# Patient Record
Sex: Male | Born: 2000 | Race: Black or African American | Hispanic: No | Marital: Single | Smoking: Former smoker
Health system: Southern US, Community
[De-identification: ages and names within clinical notes are randomized; demographics above are authoritative.]

## PROBLEM LIST (undated history)

## (undated) DIAGNOSIS — J45909 Unspecified asthma, uncomplicated: Secondary | ICD-10-CM

---
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
## 2019-11-21 ENCOUNTER — Emergency Department
Admission: EM | Admit: 2019-11-21 | Discharge: 2019-11-21 | Disposition: A | Discharge status: Home / Self Care | Payer: Medicaid Other | Attending: Emergency Medicine | Admitting: Emergency Medicine

## 2019-11-21 ENCOUNTER — Emergency Department: Payer: Medicaid Other

## 2019-11-21 ENCOUNTER — Other Ambulatory Visit: Payer: Self-pay

## 2019-11-21 ENCOUNTER — Encounter: Payer: Self-pay | Admitting: Emergency Medicine

## 2019-11-21 DIAGNOSIS — R05 Cough: Secondary | ICD-10-CM | POA: Diagnosis present

## 2019-11-21 DIAGNOSIS — J4521 Mild intermittent asthma with (acute) exacerbation: Secondary | ICD-10-CM | POA: Diagnosis not present

## 2019-11-21 DIAGNOSIS — F17200 Nicotine dependence, unspecified, uncomplicated: Secondary | ICD-10-CM | POA: Diagnosis not present

## 2019-11-21 HISTORY — DX: Unspecified asthma, uncomplicated: J45.909

## 2019-11-21 MED ORDER — PREDNISONE 10 MG PO TABS: ORAL_TABLET | ORAL | 0 refills | Status: DC

## 2019-11-21 MED ORDER — DEXAMETHASONE SODIUM PHOSPHATE 10 MG/ML IJ SOLN
10.0000 mg | Freq: Once | INTRAMUSCULAR | Status: DC
  Filled 2019-11-21: qty 1

## 2019-11-21 MED ORDER — IPRATROPIUM-ALBUTEROL 0.5-2.5 (3) MG/3ML IN SOLN
3.0000 mL | Freq: Once | RESPIRATORY_TRACT | Status: AC
  Administered 2019-11-21: 3 mL via RESPIRATORY_TRACT
  Filled 2019-11-21: qty 3

## 2019-11-21 MED ORDER — ALBUTEROL SULFATE HFA 108 (90 BASE) MCG/ACT IN AERS: 2.0000 | INHALATION_SPRAY | Freq: Four times a day (QID) | RESPIRATORY_TRACT | 0 refills | Status: DC | PRN

## 2019-11-21 MED ORDER — DEXAMETHASONE SODIUM PHOSPHATE 10 MG/ML IJ SOLN
10.0000 mg | Freq: Once | INTRAMUSCULAR | Status: AC
  Administered 2019-11-21: 10 mg via INTRAMUSCULAR

## 2019-11-21 NOTE — Discharge Instructions (Addendum)
You were seen today for an asthma exacerbation.  Your chest x-ray was negative.  You were given a nebulizer treatment and steroid injection.  I have given you prescription for oral steroids to take for the next 6 days.  Please avoid NSAIDs OTC such as Aleve, ibuprofen, naproxen, or Motrin.  I have also given you a prescription for an inhaler to take every 4-6 hours as needed.

## 2019-11-21 NOTE — ED Notes (Signed)
Pt reports he still feels some tightness/SOB. Respirations are even and regular, breathing is labored. Inspiratory wheezes noted bilaterally.

## 2019-11-21 NOTE — ED Triage Notes (Signed)
Patient states that he is having an asthma attack that started this morning. Patient states that he is out of his inhaler. Patient with expiratory wheezes throughout lung fields.

## 2019-11-21 NOTE — ED Provider Notes (Signed)
[facility] Emergency Department Provider Note ____________________________________________  Time seen: 2225  I have reviewed the triage vital signs and the nursing notes.  HISTORY  Chief Complaint  Asthma   HPI Tyler Friedman is a 19 y.o. male presents to the ER today with complaint of cough, wheezing and shortness of breath.  He reports this started this morning.  The cough is mostly nonproductive.  He denies headache, runny nose, nasal congestion, ear pain, sore throat, loss of taste or smell.  He denies fever, chills or body aches.  He has a history of asthma but has not had an asthma attack in the last 2 to 3 years.  He reports he did have an inhaler but it has run out.  He does smoke.  He has not had sick contacts or exposure to Covid that he is aware of.  Past Medical History:  Diagnosis Date  . Asthma     There are no problems to display for this patient.   History reviewed. No pertinent surgical history.  Prior to Admission medications   Medication Sig Start Date End Date Taking? Authorizing Provider  albuterol (VENTOLIN HFA) 108 (90 Base) MCG/ACT inhaler Inhale 2 puffs into the lungs every 6 (six) hours as needed for wheezing or shortness of breath. 11/21/19   Kent Braunschweig, Salvadore Oxford, NP  predniSONE (DELTASONE) 10 MG tablet Take 6 tabs day 1, 5 tabs day 2, 4 tabs day 3, 3 tabs day 4, 2 tabs day 5, 1 tab day 6 11/21/19   Lorre Munroe, NP    Allergies Patient has no known allergies.  No family history on file.  Social History Social History   Tobacco Use  . Smoking status: Current Every Day Smoker  . Smokeless tobacco: Never Used  Substance Use Topics  . Alcohol use: Never  . Drug use: Never    Review of Systems  Constitutional: Negative for fever, chills or body aches. Eyes: Negative for visual changes. ENT: Negative for runny nose, nasal congestion, ear pain, loss of taste or smell, or sore throat. Cardiovascular: Negative for chest pain or  chest tightness. Respiratory: Positive for cough and shortness of breath. ____________________________________________  PHYSICAL EXAM:  VITAL SIGNS: ED Triage Vitals  Enc Vitals Group     BP 11/21/19 2149 (!) 163/106     Pulse Rate 11/21/19 2149 92     Resp 11/21/19 2149 (!) 24     Temp 11/21/19 2149 98.6 F (37 C)     Temp Source 11/21/19 2149 Oral     SpO2 11/21/19 2149 97 %     Weight 11/21/19 2150 300 lb (136.1 kg)     Height 11/21/19 2150 5\' 10"  (1.778 m)     Head Circumference --      Peak Flow --      Pain Score 11/21/19 2150 0     Pain Loc --      Pain Edu? --      Excl. in GC? --     Constitutional: Alert and oriented.  Obese, in no distress. Head: Normocephalic and atraumatic. Eyes: Conjunctivae are normal. PERRL. Normal extraocular movements Ears: Canals clear. TMs intact bilaterally. Nose: No congestion/rhinorrhea/epistaxis. Mouth/Throat: Mucous membranes are moist.  No posterior pharynx exudate or erythema noted. Hematological/Lymphatic/Immunological: No cervical lymphadenopathy. Cardiovascular: Normal rate, regular rhythm.  Respiratory: Normal respiratory effort.  Intermittent left expiratory wheeze noted.  No rales or rhonchi noted. Neurologic:  Normal gait without ataxia. Normal speech and language. No gross  focal neurologic deficits are appreciated. Skin:  Skin is warm, dry and intact. No rash noted.  ______________________________________________   RADIOLOGY   Imaging Orders     DG Chest 2 View IMPRESSION:  No active cardiopulmonary disease.    ____________________________________________   INITIAL IMPRESSION / ASSESSMENT AND PLAN / ED COURSE  Cough, Wheezing and SOB secondary to Asthma:  Chest xray negative Duoneb x 1 in ER Decadron 10 mg IM x1 RX for Albuterol inhaler 1-2 puffs Q4-6H prn RX for Pred Taper x 6 days ____________________________________________  FINAL CLINICAL IMPRESSION(S) / ED DIAGNOSES  Final diagnoses:  Mild  intermittent asthma with exacerbation      Jearld Fenton, NP 11/21/19 2302    Earleen Newport, MD 11/21/19 2308

## 2019-12-24 ENCOUNTER — Other Ambulatory Visit: Payer: Self-pay

## 2019-12-24 ENCOUNTER — Emergency Department
Admission: EM | Admit: 2019-12-24 | Discharge: 2019-12-24 | Disposition: A | Discharge status: Home / Self Care | Payer: Medicaid Other | Attending: Emergency Medicine | Admitting: Emergency Medicine

## 2019-12-24 DIAGNOSIS — F17210 Nicotine dependence, cigarettes, uncomplicated: Secondary | ICD-10-CM | POA: Diagnosis not present

## 2019-12-24 DIAGNOSIS — Z760 Encounter for issue of repeat prescription: Secondary | ICD-10-CM | POA: Insufficient documentation

## 2019-12-24 MED ORDER — ALBUTEROL SULFATE HFA 108 (90 BASE) MCG/ACT IN AERS
2.0000 | INHALATION_SPRAY | Freq: Four times a day (QID) | RESPIRATORY_TRACT | 0 refills | Status: DC | PRN
Start: 2019-12-24 — End: 2020-01-14

## 2019-12-24 NOTE — Discharge Instructions (Signed)
Call all 3 of the clinics listed on your discharge papers to see if you can obtain a primary care provider.  The albuterol inhaler was sent to your pharmacy.

## 2019-12-24 NOTE — ED Triage Notes (Signed)
Pt states he has asthma and is out of his albuterol inhaler, states he does not have a PCP . Pt is in NAD, respirations WNL.

## 2019-12-24 NOTE — ED Provider Notes (Signed)
[facility] Emergency Department Provider Note   ____________________________________________   First MD Initiated Contact with Patient 12/24/19 [phone]     (approximate)  I have reviewed the triage vital signs and the nursing notes.   HISTORY  Chief Complaint Medication Refill    HPI Tyler Friedman is a 19 y.o. male presents for an albuterol inhaler.  He states he does not have a PCP since he was 17.  He reports that he is currently trying to stop smoking.      Past Medical History:  Diagnosis Date   Asthma     There are no problems to display for this patient.   History reviewed. No pertinent surgical history.  Prior to Admission medications   Medication Sig Start Date End Date Taking? Authorizing Provider  albuterol (VENTOLIN HFA) 108 (90 Base) MCG/ACT inhaler Inhale 2 puffs into the lungs every 6 (six) hours as needed for wheezing or shortness of breath. 12/24/19   Tommi Rumps, PA-C    Allergies Patient has no known allergies.  No family history on file.  Social History Social History   Tobacco Use   Smoking status: Current Every Day Smoker    Types: Cigarettes   Smokeless tobacco: Never Used  Substance Use Topics   Alcohol use: Never   Drug use: Never    Review of Systems Constitutional: No fever/chills Eyes: No visual changes. ENT: No sore throat. Cardiovascular: Denies chest pain. Respiratory: Denies shortness of breath.  History of asthma. Gastrointestinal: No abdominal pain.  No constipation. Musculoskeletal: Negative for muscle skeletal pain. Skin: Negative for rash. Neurological: Negative for headaches, focal weakness or numbness.  ____________________________________________   PHYSICAL EXAM:  VITAL SIGNS: ED Triage Vitals  Enc Vitals Group     BP 12/24/19 0743 138/88     Pulse Rate 12/24/19 0743 78     Resp 12/24/19 0743 17     Temp 12/24/19 0743 97.6 F (36.4 C)     Temp Source 12/24/19 0743  Oral     SpO2 12/24/19 0743 97 %     Weight 12/24/19 0740 290 lb (131.5 kg)     Height 12/24/19 0740 5\' 10"  (1.778 m)     Head Circumference --      Peak Flow --      Pain Score 12/24/19 0740 0     Pain Loc --      Pain Edu? --      Excl. in GC? --    Constitutional: Alert and oriented. Well appearing and in no acute distress. Eyes: Conjunctivae are normal. PERRL. EOMI. Head: Atraumatic. Nose: No congestion/rhinnorhea. Neck: No stridor.   Cardiovascular: Normal rate, regular rhythm. Grossly normal heart sounds.  Good peripheral circulation. Respiratory: Normal respiratory effort.  No retractions. Lungs minimal expiratory wheeze noted.  Patient is able to speak in complete sentences without any difficulty. Gastrointestinal: Soft and nontender. No distention.  Musculoskeletal: Moves upper and lower extremities without any difficulty normal gait was noted. Neurologic:  Normal speech and language. No gross focal neurologic deficits are appreciated. No gait instability. Skin:  Skin is warm, dry and intact. No rash noted. Psychiatric: Mood and affect are normal. Speech and behavior are normal.  ____________________________________________   LABS (all labs ordered are listed, but only abnormal results are displayed)  Labs Reviewed - No data to display  PROCEDURES  Procedure(s) performed (including Critical Care):  Procedures   ____________________________________________   INITIAL IMPRESSION / ASSESSMENT AND PLAN / ED COURSE  19 year old male presents to the ED for refill of his albuterol inhaler.  Patient states he does not have a PCP.  He was encouraged to get a PCP and a list of clinics was on his discharge papers.  He is to call each 1 to see if they are taking any new patients.  Albuterol inhaler was sent to his pharmacy. ____________________________________________   FINAL CLINICAL IMPRESSION(S) / ED DIAGNOSES  Final diagnoses:  Encounter for medication refill      ED Discharge Orders         Ordered    albuterol (VENTOLIN HFA) 108 (90 Base) MCG/ACT inhaler  Every 6 hours PRN     Discontinue  Reprint     12/24/19 0834           Note:  This document was prepared using Dragon voice recognition software and may include unintentional dictation errors.    Tommi Rumps, PA-C 12/24/19 5366    Concha Se, MD 12/24/19 [phone]

## 2020-01-14 ENCOUNTER — Other Ambulatory Visit: Payer: Self-pay

## 2020-01-14 ENCOUNTER — Encounter: Payer: Self-pay | Admitting: Emergency Medicine

## 2020-01-14 ENCOUNTER — Emergency Department
Admission: EM | Admit: 2020-01-14 | Discharge: 2020-01-14 | Disposition: A | Discharge status: Home / Self Care | Payer: Medicaid Other | Attending: Emergency Medicine | Admitting: Emergency Medicine

## 2020-01-14 DIAGNOSIS — Z760 Encounter for issue of repeat prescription: Secondary | ICD-10-CM

## 2020-01-14 DIAGNOSIS — R030 Elevated blood-pressure reading, without diagnosis of hypertension: Secondary | ICD-10-CM | POA: Insufficient documentation

## 2020-01-14 MED ORDER — ALBUTEROL SULFATE HFA 108 (90 BASE) MCG/ACT IN AERS
2.0000 | INHALATION_SPRAY | Freq: Four times a day (QID) | RESPIRATORY_TRACT | 0 refills | Status: DC | PRN
Start: 2020-01-14 — End: 2021-11-16

## 2020-01-14 NOTE — ED Triage Notes (Signed)
Needs a refill for his inhaler.  Has appointment with PCP, but appointment is not until August.   AAOx3.  Skin warm and dry.  NAD

## 2020-01-14 NOTE — ED Provider Notes (Signed)
[facility] Emergency Department Provider Note   ____________________________________________   None    (approximate)  I have reviewed the triage vital signs and the nursing notes.   HISTORY  Chief Complaint Medication Refill    HPI Tyler Friedman is a 19 y.o. male patient requesting refill of Ventolin for asthmatic condition.  Patient state he has has establish care with PCP but cannot be seen until August.  Patient denies any discomfort at this time.  Patient also has elevated blood pressure noted in triage.  Patient states this will be further evaluated by his new PCP next month.         Past Medical History:  Diagnosis Date  . Asthma     There are no problems to display for this patient.   History reviewed. No pertinent surgical history.  Prior to Admission medications   Medication Sig Start Date End Date Taking? Authorizing Provider  albuterol (VENTOLIN HFA) 108 (90 Base) MCG/ACT inhaler Inhale 2 puffs into the lungs every 6 (six) hours as needed for wheezing or shortness of breath. 01/14/20   Joni Reining, PA-C    Allergies Patient has no known allergies.  No family history on file.  Social History Social History   Tobacco Use  . Smoking status: Current Every Day Smoker    Types: Cigarettes  . Smokeless tobacco: Never Used  Substance Use Topics  . Alcohol use: Never  . Drug use: Never    Review of Systems Constitutional: No fever/chills Eyes: No visual changes. ENT: No sore throat. Cardiovascular: Denies chest pain. Respiratory: Denies shortness of breath. Gastrointestinal: No abdominal pain.  No nausea, no vomiting.  No diarrhea.  No constipation. Genitourinary: Negative for dysuria. Musculoskeletal: Negative for back pain. Skin: Negative for rash. Neurological: Negative for headaches, focal weakness or numbness.   ____________________________________________   PHYSICAL EXAM:  VITAL SIGNS: ED Triage Vitals    Enc Vitals Group     BP 01/14/20 0907 (!) 179/81     Pulse Rate 01/14/20 0907 66     Resp 01/14/20 0907 18     Temp 01/14/20 0907 98.1 F (36.7 C)     Temp Source 01/14/20 0907 Oral     SpO2 01/14/20 0907 97 %     Weight 01/14/20 0902 289 lb 14.5 oz (131.5 kg)     Height 01/14/20 0902 5\' 10"  (1.778 m)     Head Circumference --      Peak Flow --      Pain Score 01/14/20 0902 0     Pain Loc --      Pain Edu? --      Excl. in GC? --    Constitutional: Alert and oriented. Well appearing and in no acute distress. Neck: No stridor.  Cardiovascular: Normal rate, regular rhythm. Grossly normal heart sounds.  Good peripheral circulation. Respiratory: Normal respiratory effort.  No retractions. Lungs CTAB.   ____________________________________________   LABS (all labs ordered are listed, but only abnormal results are displayed)  Labs Reviewed - No data to display ____________________________________________  EKG   ____________________________________________  RADIOLOGY  ED MD interpretation:    Official radiology report(s): No results found.  ____________________________________________   PROCEDURES  Procedure(s) performed (including Critical Care):  Procedures   ____________________________________________   INITIAL IMPRESSION / ASSESSMENT AND PLAN / ED COURSE  As part of my medical decision making, I reviewed the following data within the electronic MEDICAL RECORD NUMBER     Patient presents for  refill of Ventolin for his asthmatic condition.  Patient that he needs medication to tide him over until he can see his new PCP next month.    OSHAY Friedman was evaluated in Emergency Department on 01/14/2020 for the symptoms described in the history of present illness. He was evaluated in the context of the global COVID-19 pandemic, which necessitated consideration that the patient might be at risk for infection with the SARS-CoV-2 virus that causes COVID-19. Institutional  protocols and algorithms that pertain to the evaluation of patients at risk for COVID-19 are in a state of rapid change based on information released by regulatory bodies including the CDC and federal and state organizations. These policies and algorithms were followed during the patient's care in the ED.       ____________________________________________   FINAL CLINICAL IMPRESSION(S) / ED DIAGNOSES  Final diagnoses:  Medication refill     ED Discharge Orders         Ordered    albuterol (VENTOLIN HFA) 108 (90 Base) MCG/ACT inhaler  Every 6 hours PRN     Discontinue  Reprint     01/14/20 0934           Note:  This document was prepared using Dragon voice recognition software and may include unintentional dictation errors.    Joni Reining, PA-C 01/14/20 [id number]    Sharman Cheek, MD 01/14/20 1459

## 2020-01-14 NOTE — ED Notes (Signed)
Pt here for refill on albuterol inhaler.  Has appointment to set up PCP but cannot get in until august. No complaints today.

## 2020-11-14 ENCOUNTER — Emergency Department
Admission: EM | Admit: 2020-11-14 | Discharge: 2020-11-14 | Disposition: A | Discharge status: Home / Self Care | Payer: Medicaid Other | Attending: Emergency Medicine | Admitting: Emergency Medicine

## 2020-11-14 ENCOUNTER — Emergency Department: Payer: Medicaid Other

## 2020-11-14 ENCOUNTER — Other Ambulatory Visit: Payer: Self-pay

## 2020-11-14 DIAGNOSIS — J4521 Mild intermittent asthma with (acute) exacerbation: Secondary | ICD-10-CM | POA: Insufficient documentation

## 2020-11-14 DIAGNOSIS — R059 Cough, unspecified: Secondary | ICD-10-CM | POA: Diagnosis present

## 2020-11-14 DIAGNOSIS — F17210 Nicotine dependence, cigarettes, uncomplicated: Secondary | ICD-10-CM | POA: Insufficient documentation

## 2020-11-14 MED ORDER — BENZONATATE 100 MG PO CAPS: 200.0000 mg | ORAL_CAPSULE | Freq: Three times a day (TID) | ORAL | 0 refills | Status: AC | PRN

## 2020-11-14 MED ORDER — ALBUTEROL SULFATE HFA 108 (90 BASE) MCG/ACT IN AERS
2.0000 | INHALATION_SPRAY | Freq: Four times a day (QID) | RESPIRATORY_TRACT | 2 refills | Status: DC | PRN
Start: 2020-11-14 — End: 2021-11-16

## 2020-11-14 MED ORDER — METHYLPREDNISOLONE 4 MG PO TBPK
ORAL_TABLET | ORAL | 0 refills | Status: DC
Start: 2020-11-14 — End: 2021-11-16

## 2020-11-14 MED ORDER — PREDNISONE 20 MG PO TABS
60.0000 mg | ORAL_TABLET | Freq: Once | ORAL | Status: AC
  Administered 2020-11-14: 60 mg via ORAL
  Filled 2020-11-14: qty 3

## 2020-11-14 MED ORDER — IPRATROPIUM-ALBUTEROL 0.5-2.5 (3) MG/3ML IN SOLN
3.0000 mL | Freq: Once | RESPIRATORY_TRACT | Status: AC
  Administered 2020-11-14: 3 mL via RESPIRATORY_TRACT
  Filled 2020-11-14: qty 3

## 2020-11-14 NOTE — ED Provider Notes (Signed)
[facility] Emergency Department Provider Note   ____________________________________________   Event Date/Time   First MD Initiated Contact with Patient 11/14/20 [phone]     (approximate)  I have reviewed the triage vital signs and the nursing notes.   HISTORY  Chief Complaint Asthma    HPI Tyler Friedman is a 20 y.o. male patient presents with wheezing and coughing.  Patient believes allergies is making his wheezing worse.  Patient has a history of asthma but is out of his inhaler.  Last asthma attack was greater than 6 months ago.         Past Medical History:  Diagnosis Date  . Asthma     There are no problems to display for this patient.   History reviewed. No pertinent surgical history.  Prior to Admission medications   Medication Sig Start Date End Date Taking? Authorizing Provider  albuterol (VENTOLIN HFA) 108 (90 Base) MCG/ACT inhaler Inhale 2 puffs into the lungs every 6 (six) hours as needed for wheezing or shortness of breath. 11/14/20  Yes Joni Reining, PA-C  benzonatate (TESSALON PERLES) 100 MG capsule Take 2 capsules (200 mg total) by mouth 3 (three) times daily as needed. 11/14/20 11/14/21 Yes Joni Reining, PA-C  methylPREDNISolone (MEDROL DOSEPAK) 4 MG TBPK tablet Take Tapered dose as directed 11/14/20  Yes Joni Reining, PA-C  albuterol (VENTOLIN HFA) 108 (90 Base) MCG/ACT inhaler Inhale 2 puffs into the lungs every 6 (six) hours as needed for wheezing or shortness of breath. 01/14/20   Joni Reining, PA-C    Allergies Patient has no known allergies.  No family history on file.  Social History Social History   Tobacco Use  . Smoking status: Current Every Day Smoker    Types: Cigarettes  . Smokeless tobacco: Never Used  Substance Use Topics  . Alcohol use: Never  . Drug use: Never    Review of Systems Constitutional: No fever/chills Eyes: No visual changes. ENT: No sore throat. Cardiovascular: Denies chest  pain. Respiratory: Mild shortness of breath and wheezing. Gastrointestinal: No abdominal pain.  No nausea, no vomiting.  No diarrhea.  No constipation. Genitourinary: Negative for dysuria. Musculoskeletal: Negative for back pain. Skin: Negative for rash. Neurological: Negative for headaches, focal weakness or numbness.   ____________________________________________   PHYSICAL EXAM:  VITAL SIGNS: ED Triage Vitals  Enc Vitals Group     BP 11/14/20 0737 (!) 143/89     Pulse Rate 11/14/20 0737 73     Resp --      Temp 11/14/20 0737 99.5 F (37.5 C)     Temp Source 11/14/20 0737 Oral     SpO2 11/14/20 0737 95 %     Weight 11/14/20 0746 289 lb 14.5 oz (131.5 kg)     Height 11/14/20 0746 5\' 10"  (1.778 m)     Head Circumference --      Peak Flow --      Pain Score 11/14/20 0734 0     Pain Loc --      Pain Edu? --      Excl. in GC? --    Constitutional: Alert and oriented. Well appearing and in no acute distress. Nose: No congestion/rhinnorhea. Mouth/Throat: Mucous membranes are moist.  Oropharynx non-erythematous. Neck: No stridor.  Hematological/Lymphatic/Immunilogical: No cervical lymphadenopathy. Cardiovascular: Normal rate, regular rhythm. Grossly normal heart sounds.  Good peripheral circulation. Respiratory: Normal respiratory effort.  No retractions. Lungs inspiratory wheezing. Gastrointestinal: Soft and nontender. No distention. No abdominal  bruits. No CVA tenderness. Musculoskeletal: No lower extremity tenderness nor edema.  No joint effusions. Neurologic:  Normal speech and language. No gross focal neurologic deficits are appreciated. No gait instability. Skin:  Skin is warm, dry and intact. No rash noted. Psychiatric: Mood and affect are normal. Speech and behavior are normal.  ____________________________________________   LABS (all labs ordered are listed, but only abnormal results are displayed)  Labs Reviewed - No data to  display ____________________________________________  EKG   ____________________________________________  RADIOLOGY I, Joni Reining, personally viewed and evaluated these images (plain radiographs) as part of my medical decision making, as well as reviewing the written report by the radiologist.  ED MD interpretation: No acute findings on chest x-ray. Official radiology report(s): DG Chest 2 View  Result Date: 11/14/2020 CLINICAL DATA:  Cough, shortness of breath and wheezing. History of asthma. EXAM: CHEST - 2 VIEW COMPARISON:  None. FINDINGS: The heart size and mediastinal contours are within normal limits. There is no evidence of pulmonary edema, consolidation, pneumothorax, nodule or pleural fluid. No significant hyperinflation. The visualized skeletal structures are unremarkable. IMPRESSION: No active cardiopulmonary disease. Electronically Signed   By: Irish Lack M.D.   On: 11/14/2020 08:34    ____________________________________________   PROCEDURES  Procedure(s) performed (including Critical Care):  Procedures   ____________________________________________   INITIAL IMPRESSION / ASSESSMENT AND PLAN / ED COURSE  As part of my medical decision making, I reviewed the following data within the electronic MEDICAL RECORD NUMBER         Patient presents with an asthma attack.  Patient believes her allergies are worsening his condition.  Patient is out of his and prednisone.  Since he is not in target over 6 months.  Discussed no acute findings on chest x-ray.  Patient condition improved status post 1 DuoNeb treatment.  Patient given discharge care instructions and prescription.  Patient vies follow-up PCP.      ____________________________________________   FINAL CLINICAL IMPRESSION(S) / ED DIAGNOSES  Final diagnoses:  Mild intermittent asthma with exacerbation     ED Discharge Orders         Ordered    albuterol (VENTOLIN HFA) 108 (90 Base) MCG/ACT inhaler   Every 6 hours PRN        11/14/20 0900    methylPREDNISolone (MEDROL DOSEPAK) 4 MG TBPK tablet        11/14/20 0900    benzonatate (TESSALON PERLES) 100 MG capsule  3 times daily PRN        11/14/20 0900          *Please note:  Tyler Friedman was evaluated in Emergency Department on 11/14/2020 for the symptoms described in the history of present illness. He was evaluated in the context of the global COVID-19 pandemic, which necessitated consideration that the patient might be at risk for infection with the SARS-CoV-2 virus that causes COVID-19. Institutional protocols and algorithms that pertain to the evaluation of patients at risk for COVID-19 are in a state of rapid change based on information released by regulatory bodies including the CDC and federal and state organizations. These policies and algorithms were followed during the patient's care in the ED.  Some ED evaluations and interventions may be delayed as a result of limited staffing during and the pandemic.*   Note:  This document was prepared using Dragon voice recognition software and may include unintentional dictation errors.    Joni Reining, PA-C 11/14/20 7628    Sharyn Creamer, MD 11/14/20  1618  

## 2020-11-14 NOTE — ED Notes (Signed)
See triage note  Presents with some SOB and wheezing   States he is out of his inhaler    Last used was about 9 months ago

## 2020-11-14 NOTE — Discharge Instructions (Signed)
Read and follow discharge care

## 2020-11-14 NOTE — ED Triage Notes (Signed)
Pt comes with c/o possible asthma flare up. Pt state allergies are making it worse. Pt denies any inhaler or meds for relief. Respirations even and unlabored.

## 2021-04-06 ENCOUNTER — Other Ambulatory Visit: Payer: Self-pay

## 2021-04-06 ENCOUNTER — Emergency Department
Admission: EM | Admit: 2021-04-06 | Discharge: 2021-04-06 | Disposition: A | Discharge status: Home / Self Care | Payer: Medicaid Other | Attending: Emergency Medicine | Admitting: Emergency Medicine

## 2021-04-06 DIAGNOSIS — J45909 Unspecified asthma, uncomplicated: Secondary | ICD-10-CM | POA: Insufficient documentation

## 2021-04-06 DIAGNOSIS — R110 Nausea: Secondary | ICD-10-CM | POA: Insufficient documentation

## 2021-04-06 DIAGNOSIS — F17210 Nicotine dependence, cigarettes, uncomplicated: Secondary | ICD-10-CM | POA: Diagnosis not present

## 2021-04-06 LAB — COMPREHENSIVE METABOLIC PANEL
ALT: 34 U/L (ref 0–44)
AST: 26 U/L (ref 15–41)
Albumin: 4.2 g/dL (ref 3.5–5.0)
Alkaline Phosphatase: 72 U/L (ref 38–126)
Anion gap: 8 (ref 5–15)
BUN: 15 mg/dL (ref 6–20)
CO2: 23 mmol/L (ref 22–32)
Calcium: 9.2 mg/dL (ref 8.9–10.3)
Chloride: 105 mmol/L (ref 98–111)
Creatinine, Ser: 0.71 mg/dL (ref 0.61–1.24)
GFR, Estimated: >60 mL/min (ref >60)
Glucose, Bld: 120 mg/dL — ABNORMAL HIGH (ref 70–99)
Potassium: 3.9 mmol/L (ref 3.5–5.1)
Sodium: 136 mmol/L (ref 135–145)
Total Bilirubin: 0.8 mg/dL (ref 0.3–1.2)
Total Protein: 8.1 g/dL (ref 6.5–8.1)

## 2021-04-06 LAB — CBC
HCT: 42.1 % (ref 39.0–52.0)
Hemoglobin: 14.2 g/dL (ref 13.0–17.0)
MCH: 30.1 pg (ref 26.0–34.0)
MCHC: 33.7 g/dL (ref 30.0–36.0)
MCV: 89.4 fL (ref 80.0–100.0)
Platelets: 265 10*3/uL (ref 150–400)
RBC: 4.71 MIL/uL (ref 4.22–5.81)
RDW: 11.6 % (ref 11.5–15.5)
WBC: 9.5 10*3/uL (ref 4.0–10.5)
nRBC: 0 % (ref 0.0–0.2)

## 2021-04-06 LAB — URINALYSIS, COMPLETE (UACMP) WITH MICROSCOPIC
Bacteria, UA: NONE SEEN
Bilirubin Urine: NEGATIVE
Glucose, UA: NEGATIVE mg/dL
Hgb urine dipstick: NEGATIVE
Ketones, ur: NEGATIVE mg/dL
Leukocytes,Ua: NEGATIVE
Nitrite: NEGATIVE
Protein, ur: NEGATIVE mg/dL
Specific Gravity, Urine: 1.023 (ref 1.005–1.030)
Squamous Epithelial / HPF: NONE SEEN (ref 0–5)
pH: 6 (ref 5.0–8.0)

## 2021-04-06 LAB — LIPASE, BLOOD: Lipase: 30 U/L (ref 11–51)

## 2021-04-06 NOTE — ED Provider Notes (Signed)
[facility] Emergency Department Provider Note  ____________________________________________   Event Date/Time   First MD Initiated Contact with Patient 04/06/21 1525     (approximate)  I have reviewed the triage vital signs and the nursing notes.   HISTORY  Chief Complaint Nausea    HPI Tyler Friedman is a 20 y.o. male presents with complaints of nausea and he would also like his embedded piercings in his lips removed.  Denies fever, chills, vomiting diarrhea  Past Medical History:  Diagnosis Date   Asthma     There are no problems to display for this patient.   No past surgical history on file.  Prior to Admission medications   Medication Sig Start Date End Date Taking? Authorizing Provider  albuterol (VENTOLIN HFA) 108 (90 Base) MCG/ACT inhaler Inhale 2 puffs into the lungs every 6 (six) hours as needed for wheezing or shortness of breath. 01/14/20   Joni Reining, PA-C  albuterol (VENTOLIN HFA) 108 (90 Base) MCG/ACT inhaler Inhale 2 puffs into the lungs every 6 (six) hours as needed for wheezing or shortness of breath. 11/14/20   Joni Reining, PA-C  benzonatate (TESSALON PERLES) 100 MG capsule Take 2 capsules (200 mg total) by mouth 3 (three) times daily as needed. 11/14/20 11/14/21  Joni Reining, PA-C  methylPREDNISolone (MEDROL DOSEPAK) 4 MG TBPK tablet Take Tapered dose as directed 11/14/20   Joni Reining, PA-C    Allergies Patient has no known allergies.  No family history on file.  Social History Social History   Tobacco Use   Smoking status: Every Day    Types: Cigarettes   Smokeless tobacco: Never  Substance Use Topics   Alcohol use: Never   Drug use: Never    Review of Systems  Constitutional: No fever/chills Eyes: No visual changes. ENT: No sore throat. Respiratory: Denies cough Cardiovascular: Denies chest pain Gastrointestinal: Denies abdominal pain Genitourinary: Negative for dysuria. Musculoskeletal:  Negative for back pain. Skin: Negative for rash. Psychiatric: no mood changes,     ____________________________________________   PHYSICAL EXAM:  VITAL SIGNS: ED Triage Vitals [04/06/21 1111]  Enc Vitals Group     BP (!) 127/104     Pulse Rate 76     Resp 18     Temp 99.2 F (37.3 C)     Temp Source Oral     SpO2 99 %     Weight (!) 306 lb (138.8 kg)     Height 5\' 10"  (1.778 m)     Head Circumference      Peak Flow      Pain Score 0     Pain Loc      Pain Edu?      Excl. in GC?     Constitutional: Alert and oriented. Well appearing and in no acute distress. Eyes: Conjunctivae are normal.  Head: Atraumatic. Nose: No congestion/rhinnorhea. Mouth/Throat: Mucous membranes are moist.   Neck:  supple no lymphadenopathy noted Cardiovascular: Normal rate, regular rhythm. Heart sounds are normal Respiratory: Normal respiratory effort.  No retractions, lungs c t a  Abd: soft nontender bs normal all 4 quad GU: deferred Musculoskeletal: FROM all extremities, warm and well perfused Neurologic:  Normal speech and language.  Skin:  Skin is warm, dry and intact. No rash noted.  2 embedded piercings noted in the lip Psychiatric: Mood and affect are normal. Speech and behavior are normal.  ____________________________________________   LABS (all labs ordered are listed, but only abnormal  results are displayed)  Labs Reviewed  COMPREHENSIVE METABOLIC PANEL - Abnormal; Notable for the following components:      Result Value   Glucose, Bld 120 (*)    All other components within normal limits  URINALYSIS, COMPLETE (UACMP) WITH MICROSCOPIC - Abnormal; Notable for the following components:   Color, Urine YELLOW (*)    APPearance CLEAR (*)    All other components within normal limits  LIPASE, BLOOD  CBC    ____________________________________________   ____________________________________________  RADIOLOGY    ____________________________________________   PROCEDURES  Procedure(s) performed: No  Procedures    ____________________________________________   INITIAL IMPRESSION / ASSESSMENT AND PLAN / ED COURSE  Pertinent labs & imaging results that were available during my care of the patient were reviewed by me and considered in my medical decision making (see chart for details).   Patient's 20 year old male presents with nausea and requesting if we can give him advice about piercing removal.  See HPI.  Physical exam shows patient were stable.  DDx: Gastroenteritis, acute pancreatitis, foreign body  Labs are reassuring, CBC, metabolic panel, lipase and urinalysis are all normal  Patient is to follow-up with surgeon to have the piercings removed.  They are deeply embedded and have been there for a long time.  Do not feel this appropriate at this time.  States he understands.  Very strict plan.  Discharged stable condition instructions to return if worsening     Tyler Friedman was evaluated in Emergency Department on 04/06/2021 for the symptoms described in the history of present illness. He was evaluated in the context of the global COVID-19 pandemic, which necessitated consideration that the patient might be at risk for infection with the SARS-CoV-2 virus that causes COVID-19. Institutional protocols and algorithms that pertain to the evaluation of patients at risk for COVID-19 are in a state of rapid change based on information released by regulatory bodies including the CDC and federal and state organizations. These policies and algorithms were followed during the patient's care in the ED.    As part of my medical decision making, I reviewed the following data within the electronic MEDICAL RECORD NUMBER Nursing notes reviewed and incorporated, Labs reviewed , Old chart reviewed,  Notes from prior ED visits, and [place] Controlled Substance Database  ____________________________________________   FINAL CLINICAL IMPRESSION(S) / ED DIAGNOSES  Final diagnoses:  Nausea      NEW MEDICATIONS STARTED DURING THIS VISIT:  New Prescriptions   No medications on file     Note:  This document was prepared using Dragon voice recognition software and may include unintentional dictation errors.    Faythe Ghee, PA-C 04/06/21 1541    Sharman Cheek, MD 04/06/21 [phone]

## 2021-04-06 NOTE — ED Notes (Signed)
See triage note  presents with some nausea  states he was nauseated on Tuesday and had 2 episodes of vomiting at that time  denies any sx's at present  needs work note

## 2021-04-06 NOTE — ED Notes (Signed)
Patient stable and discharged with all personal belongings and AVS. AVS and discharge instructions reviewed with patient and opportunity for questions provided.   

## 2021-04-06 NOTE — ED Triage Notes (Signed)
Pt here with nausea that started Tuesday. Pt states that he had 2 episodes of emesis. Pt denies abd pain but states that he feels really sick. PT in NAD in triage. Pt also wants to know if he can get his piercings cut out because they are embedded in his lip.

## 2021-04-06 NOTE — Discharge Instructions (Addendum)
Follow up with a surgeon to have the piercings removed

## 2021-04-06 NOTE — ED Provider Notes (Addendum)
Emergency Medicine Provider Triage Evaluation Note  Tyler Friedman , a 20 y.o. male  was evaluated in triage.  Pt complains of nausea and would also like for someone to cut the piercing out of his lip.  Review of Systems  Positive: Nausea Negative: No fever, chills, chest pain, shortness of breath or abdominal pain, vomiting or diarrhea  Physical Exam  BP (!) 127/104 (BP Location: Left Arm)   Pulse 76   Temp 99.2 F (37.3 C) (Oral)   Resp 18   Ht 5\' 10"  (1.778 m)   Wt (!) 138.8 kg   SpO2 99%   BMI 43.91 kg/m  Gen:   Awake, no distress   Resp:  Normal effort  MSK:   Moves extremities without difficulty  Other:    Medical Decision Making  Medically screening exam initiated at 11:15 AM.  Appropriate orders placed.  Tyler Friedman was informed that the remainder of the evaluation will be completed by another provider, this initial triage assessment does not replace that evaluation, and the importance of remaining in the ED until their evaluation is complete.     Ane Payment, PA-C 04/06/21 1115    04/08/21 Sherrie Mustache, PA-C 04/06/21 1529    04/08/21, MD 04/06/21 [phone]

## 2021-11-16 ENCOUNTER — Other Ambulatory Visit: Payer: Self-pay

## 2021-11-16 ENCOUNTER — Emergency Department
Admission: EM | Admit: 2021-11-16 | Discharge: 2021-11-16 | Disposition: A | Discharge status: Home / Self Care | Payer: 59 | Attending: Emergency Medicine | Admitting: Emergency Medicine

## 2021-11-16 ENCOUNTER — Encounter: Payer: Self-pay | Admitting: Emergency Medicine

## 2021-11-16 DIAGNOSIS — J45901 Unspecified asthma with (acute) exacerbation: Secondary | ICD-10-CM | POA: Insufficient documentation

## 2021-11-16 DIAGNOSIS — R0602 Shortness of breath: Secondary | ICD-10-CM | POA: Diagnosis not present

## 2021-11-16 MED ORDER — ALBUTEROL SULFATE HFA 108 (90 BASE) MCG/ACT IN AERS: 2.0000 | INHALATION_SPRAY | RESPIRATORY_TRACT | 1 refills | Status: DC | PRN

## 2021-11-16 MED ORDER — PREDNISONE 20 MG PO TABS
60.0000 mg | ORAL_TABLET | Freq: Once | ORAL | Status: AC
  Administered 2021-11-16: 60 mg via ORAL
  Filled 2021-11-16: qty 3

## 2021-11-16 MED ORDER — PREDNISONE 20 MG PO TABS: 60.0000 mg | ORAL_TABLET | Freq: Every day | ORAL | 0 refills | Status: DC

## 2021-11-16 MED ORDER — IPRATROPIUM-ALBUTEROL 0.5-2.5 (3) MG/3ML IN SOLN
3.0000 mL | Freq: Once | RESPIRATORY_TRACT | Status: AC
  Administered 2021-11-16: 3 mL via RESPIRATORY_TRACT
  Filled 2021-11-16: qty 3

## 2021-11-16 MED ORDER — ALBUTEROL SULFATE HFA 108 (90 BASE) MCG/ACT IN AERS
2.0000 | INHALATION_SPRAY | Freq: Once | RESPIRATORY_TRACT | Status: AC
  Administered 2021-11-16: 2 via RESPIRATORY_TRACT
  Filled 2021-11-16: qty 6.7

## 2021-11-16 MED ORDER — IPRATROPIUM-ALBUTEROL 0.5-2.5 (3) MG/3ML IN SOLN
3.0000 mL | RESPIRATORY_TRACT | Status: AC
  Administered 2021-11-16 (×2): 3 mL via RESPIRATORY_TRACT
  Filled 2021-11-16: qty 6

## 2021-11-16 NOTE — ED Provider Notes (Signed)
[facility] Provider Note    Event Date/Time   First MD Initiated Contact with Patient 11/16/21 [phone]     (approximate)   History   Asthma   HPI  Tyler Friedman is a 21 y.o. male with history of asthma who presents to the emergency department with shortness of breath and wheezing that started yesterday.  States he does not have an albuterol inhaler for home.  No chest pain, fevers or cough.  No lower extremity swelling or pain.  Feels like his typical asthma exacerbation.   History provided by patient.    Past Medical History:  Diagnosis Date   Asthma     No past surgical history on file.  MEDICATIONS:  Prior to Admission medications   Medication Sig Start Date End Date Taking? Authorizing Provider  albuterol (VENTOLIN HFA) 108 (90 Base) MCG/ACT inhaler Inhale 2 puffs into the lungs every 6 (six) hours as needed for wheezing or shortness of breath. 01/14/20   Joni Reining, PA-C  albuterol (VENTOLIN HFA) 108 (90 Base) MCG/ACT inhaler Inhale 2 puffs into the lungs every 6 (six) hours as needed for wheezing or shortness of breath. 11/14/20   Joni Reining, PA-C  methylPREDNISolone (MEDROL DOSEPAK) 4 MG TBPK tablet Take Tapered dose as directed 11/14/20   Joni Reining, PA-C    Physical Exam   Triage Vital Signs: ED Triage Vitals  Enc Vitals Group     BP 11/16/21 0449 138/63     Pulse Rate 11/16/21 0449 74     Resp 11/16/21 0449 20     Temp 11/16/21 0449 98 F (36.7 C)     Temp Source 11/16/21 0449 Oral     SpO2 11/16/21 0449 98 %     Weight 11/16/21 0445 286 lb (129.7 kg)     Height 11/16/21 0445 5\' 10"  (1.778 m)     Head Circumference --      Peak Flow --      Pain Score 11/16/21 0448 0     Pain Loc --      Pain Edu? --      Excl. in GC? --     Most recent vital signs: Vitals:   11/16/21 0449 11/16/21 0500  BP: 138/63 (!) 127/55  Pulse: 74 68  Resp: 20 16  Temp: 98 F (36.7 C)   SpO2: 98% 96%    CONSTITUTIONAL: Alert and  oriented and responds appropriately to questions. Well-appearing; well-nourished HEAD: Normocephalic, atraumatic EYES: Conjunctivae clear, pupils appear equal, sclera nonicteric ENT: normal nose; moist mucous membranes NECK: Supple, normal ROM CARD: RRR; S1 and S2 appreciated; no murmurs, no clicks, no rubs, no gallops RESP: Patient has diffuse inspiratory and expiratory wheezing.  No rhonchi or rales.  Speaking full sentences.  No hypoxia or respiratory distress. ABD/GI: Normal bowel sounds; non-distended; soft, non-tender, no rebound, no guarding, no peritoneal signs BACK: The back appears normal EXT: Normal ROM in all joints; no deformity noted, no edema; no cyanosis, no calf tenderness or calf swelling SKIN: Normal color for age and race; warm; no rash on exposed skin NEURO: Moves all extremities equally, normal speech PSYCH: The patient's mood and manner are appropriate.   ED Results / Procedures / Treatments   LABS: (all labs ordered are listed, but only abnormal results are displayed) Labs Reviewed - No data to display   EKG:  RADIOLOGY: My personal review and interpretation of imaging:    I have personally reviewed all radiology  reports.   No results found.   PROCEDURES:  Critical Care performed: No     Procedures    IMPRESSION / MDM / ASSESSMENT AND PLAN / ED COURSE  I reviewed the triage vital signs and the nursing notes.    Patient here with complaints of an asthma exacerbation.  No hypoxia or respiratory distress.     DIFFERENTIAL DIAGNOSIS (includes but not limited to):   Asthma exacerbation, doubt pneumonia, CHF, pneumothorax, PE, ACS   PLAN: We will give DuoNebs, prednisone.  I do not feel there is any indication for blood work or chest x-ray at this time.  Will monitor closely.   MEDICATIONS GIVEN IN ED: Medications  albuterol (VENTOLIN HFA) 108 (90 Base) MCG/ACT inhaler 2 puff (has no administration in time range)  ipratropium-albuterol  (DUONEB) 0.5-2.5 (3) MG/3ML nebulizer solution 3 mL (has no administration in time range)  ipratropium-albuterol (DUONEB) 0.5-2.5 (3) MG/3ML nebulizer solution 3 mL (3 mLs Nebulization Given 11/16/21 0509)  predniSONE (DELTASONE) tablet 60 mg (60 mg Oral Given 11/16/21 0506)     ED COURSE: Patient feels much better and feels like he is breathing easier.  Only some scattered expiratory wheezing on exam now.  Agrees to a third breathing treatment.  Will discharge with albuterol inhaler and give refills for home and prescription for prednisone burst.  Has PCP for follow-up.   At this time, I do not feel there is any life-threatening condition present. I reviewed all nursing notes, vitals, pertinent previous records.  All lab and urine results, EKGs, imaging ordered have been independently reviewed and interpreted by myself.  I reviewed all available radiology reports from any imaging ordered this visit.  Based on my assessment, I feel the patient is safe to be discharged home without further emergent workup and can continue workup as an outpatient as needed. Discussed all findings, treatment plan as well as usual and customary return precautions with patient.  They verbalize understanding and are comfortable with this plan.  Outpatient follow-up has been provided as needed.  All questions have been answered.    CONSULTS: No admission needed given no hypoxia or respiratory distress, breath sounds improving with breathing treatments.   OUTSIDE RECORDS REVIEWED: Reviewed patient's last office visit with Ronnette Juniper on 07/02/2017.         FINAL CLINICAL IMPRESSION(S) / ED DIAGNOSES   Final diagnoses:  Exacerbation of asthma, unspecified asthma severity, unspecified whether persistent     Rx / DC Orders   ED Discharge Orders          Ordered    predniSONE (DELTASONE) 20 MG tablet  Daily        11/16/21 0559    albuterol (VENTOLIN HFA) 108 (90 Base) MCG/ACT inhaler  Every 4 hours PRN         11/16/21 0559             Note:  This document was prepared using Dragon voice recognition software and may include unintentional dictation errors.   Imer Foxworth, Layla Maw, DO 11/16/21 [phone]

## 2021-11-16 NOTE — ED Notes (Signed)
Pt discharge information reviewed. Pt understands need for follow up care and when to return if symptoms worsen. All questions answered. Pt is alert and oriented with even and regular respirations. Pt is seen ambulating out of department with string steady gait.   

## 2021-11-16 NOTE — ED Notes (Signed)
Albuterol inhaler ordered from pharmacy  ?

## 2021-11-16 NOTE — ED Triage Notes (Signed)
Patient ambulatory to triage with steady gait, without difficulty or distress noted; pt reports wheezing tonight, nonprod cough; no inhaler in last yr

## 2022-01-01 ENCOUNTER — Emergency Department
Admission: EM | Admit: 2022-01-01 | Discharge: 2022-01-01 | Disposition: A | Discharge status: Home / Self Care | Payer: 59 | Attending: Emergency Medicine | Admitting: Emergency Medicine

## 2022-01-01 ENCOUNTER — Other Ambulatory Visit: Payer: Self-pay

## 2022-01-01 DIAGNOSIS — J45909 Unspecified asthma, uncomplicated: Secondary | ICD-10-CM | POA: Diagnosis not present

## 2022-01-01 DIAGNOSIS — Z760 Encounter for issue of repeat prescription: Secondary | ICD-10-CM | POA: Insufficient documentation

## 2022-01-01 MED ORDER — ALBUTEROL SULFATE HFA 108 (90 BASE) MCG/ACT IN AERS
1.0000 | INHALATION_SPRAY | Freq: Once | RESPIRATORY_TRACT | Status: AC
  Administered 2022-01-01: 1 via RESPIRATORY_TRACT
  Filled 2022-01-01 (×2): qty 6.7

## 2022-01-01 MED ORDER — ALBUTEROL SULFATE HFA 108 (90 BASE) MCG/ACT IN AERS: 2.0000 | INHALATION_SPRAY | RESPIRATORY_TRACT | 2 refills | Status: DC | PRN

## 2022-01-01 MED ORDER — ALBUTEROL SULFATE (2.5 MG/3ML) 0.083% IN NEBU
2.5000 mg | INHALATION_SOLUTION | Freq: Once | RESPIRATORY_TRACT | Status: DC
  Filled 2022-01-01: qty 3

## 2022-01-01 NOTE — ED Triage Notes (Signed)
Pt states he has asthma and only has 3 puffs left on his inhaler and needs a refill

## 2022-01-01 NOTE — ED Provider Notes (Signed)
[facility] Provider Note   Event Date/Time   First MD Initiated Contact with Patient 01/01/22 [phone]     (approximate) History  Medication Refill  HPI Tyler Friedman is a 21 y.o. male with a stated past medical history of asthma presents for medication refill of his albuterol inhaler.  Patient states that he currently uses inhaler infrequently however he has been unable to refill the prescription as he does not have the money to present to his primary physician's office for a refill.  Patient currently denies any active shortness of breath, chest pain, fever, productive cough, recent travel/sick contacts.   Physical Exam  Triage Vital Signs: ED Triage Vitals  Enc Vitals Group     BP 01/01/22 0924 (!) 143/80     Pulse Rate 01/01/22 0924 64     Resp 01/01/22 0924 20     Temp 01/01/22 0924 98.4 F (36.9 C)     Temp Source 01/01/22 0924 Oral     SpO2 01/01/22 0924 95 %     Weight 01/01/22 0925 285 lb 15 oz (129.7 kg)     Height 01/01/22 0925 5\' 10"  (1.778 m)     Head Circumference --      Peak Flow --      Pain Score 01/01/22 0923 0     Pain Loc --      Pain Edu? --      Excl. in GC? --    Most recent vital signs: Vitals:   01/01/22 0924  BP: (!) 143/80  Pulse: 64  Resp: 20  Temp: 98.4 F (36.9 C)  SpO2: 95%   General: Awake, oriented x4. CV:  Good peripheral perfusion.  Resp:  Normal effort.  Abd:  No distention.  Other:  Obese young adult African-American male sitting on stretcher in no acute distress ED Results / Procedures / Treatments   PROCEDURES: Critical Care performed: No Procedures MEDICATIONS ORDERED IN ED: Medications  albuterol (PROVENTIL) (2.5 MG/3ML) 0.083% nebulizer solution 2.5 mg (has no administration in time range)   IMPRESSION / MDM / ASSESSMENT AND PLAN / ED COURSE  I reviewed the triage vital signs and the nursing notes.                             The patient is on the cardiac monitor to evaluate for evidence of  arrhythmia and/or significant heart rate changes. Patient's presentation is most consistent with acute presentation with potential threat to life or bodily function. Patient is a 21 year old male who presents for medication refill of his albuterol inhaler.  Patient does not show any signs of active asthma laceration at this time and shows no active respiratory distress.  Patient denies any shortness of breath or chest pain.  Patient did request an albuterol inhaler treatment prior to discharge as he is feeling short of breath.  Patient given treatment prior to discharge as well as refill of his albuterol inhaler.  Dispo: Discharge home   FINAL CLINICAL IMPRESSION(S) / ED DIAGNOSES   Final diagnoses:  Medication refill   Rx / DC Orders   ED Discharge Orders          Ordered    albuterol (VENTOLIN HFA) 108 (90 Base) MCG/ACT inhaler  Every 4 hours PRN        01/01/22 0923           Note:  This document was prepared using Dragon voice  recognition software and may include unintentional dictation errors.   Merwyn Katos, MD 01/01/22 [phone]

## 2022-01-01 NOTE — ED Notes (Signed)
See triage note  Presents with a request to refill his inhaler  denies any other sx's

## 2022-04-09 ENCOUNTER — Emergency Department
Admission: EM | Admit: 2022-04-09 | Discharge: 2022-04-09 | Disposition: A | Discharge status: Home / Self Care | Payer: 59 | Attending: Student | Admitting: Student

## 2022-04-09 DIAGNOSIS — J45909 Unspecified asthma, uncomplicated: Secondary | ICD-10-CM | POA: Diagnosis not present

## 2022-04-09 DIAGNOSIS — Z760 Encounter for issue of repeat prescription: Secondary | ICD-10-CM | POA: Diagnosis not present

## 2022-04-09 MED ORDER — ALBUTEROL SULFATE HFA 108 (90 BASE) MCG/ACT IN AERS: 2.0000 | INHALATION_SPRAY | Freq: Four times a day (QID) | RESPIRATORY_TRACT | 2 refills | Status: DC | PRN

## 2022-04-09 NOTE — ED Provider Notes (Signed)
[facility] Provider Note    None    (approximate)   History   Medication Refill   HPI  Tyler Friedman is a 21 y.o. male who presents today with request for refill of his inhaler.  He reports that when he pushes that nothing comes out even though the medication is still full.  He thinks that the push tab is broken.  He does not currently feel short of breath or have any pain anywhere.  He has not been wheezing today.  He reports that he is trying to exercise more and wants to be sure that he has an inhaler at his disposal if needed.  He has no other complaints today.     Physical Exam   Triage Vital Signs: ED Triage Vitals [04/09/22 1022]  Enc Vitals Group     BP (!) 181/93     Pulse Rate (!) 57     Resp 16     Temp 97.8 F (36.6 C)     Temp Source Oral     SpO2 99 %     Weight      Height      Head Circumference      Peak Flow      Pain Score 0     Pain Loc      Pain Edu?      Excl. in [place]?     Most recent vital signs: Vitals:   04/09/22 1022  BP: (!) 181/93  Pulse: (!) 57  Resp: 16  Temp: 97.8 F (36.6 C)  SpO2: 99%    Physical Exam Vitals and nursing note reviewed.  Constitutional:      General: Awake and alert. No acute distress.    Appearance: Normal appearance. The patient is normal weight.  HENT:     Head: Normocephalic and atraumatic.     Mouth: Mucous membranes are moist.  Eyes:     General: PERRL. Normal EOMs        Right eye: No discharge.        Left eye: No discharge.     Conjunctiva/sclera: Conjunctivae normal.  Cardiovascular:     Rate and Rhythm: Normal rate and regular rhythm.     Pulses: Normal pulses.  Pulmonary:     Effort: Pulmonary effort is normal. No respiratory distress.     Breath sounds: Normal breath sounds.  No wheezing noted Abdominal:     Abdomen is soft. There is no abdominal tenderness. No rebound or guarding. No distention. Musculoskeletal:        General: No swelling. Normal range of  motion.     Cervical back: Normal range of motion and neck supple.  No lower extremity swelling Skin:    General: Skin is warm and dry.     Capillary Refill: Capillary refill takes less than 2 seconds.     Findings: No rash.  Neurological:     Mental Status: The patient is awake and alert.      ED Results / Procedures / Treatments   Labs (all labs ordered are listed, but only abnormal results are displayed) Labs Reviewed - No data to display   EKG     RADIOLOGY     PROCEDURES:  Critical Care performed:   Procedures   MEDICATIONS ORDERED IN ED: Medications - No data to display   IMPRESSION / MDM / [place] / ED COURSE  I reviewed the triage vital signs and the nursing notes.  Differential diagnosis includes, but is not limited to, medication refill, asthma exacerbation.  No chest pain or shortness of breath currently.  No tachycardia or hypoxia or leg swelling to suggest PE.  He has no current complaints other than requesting a refill of his inhaler to help when needed.  This was sent to his pharmacy.  We discussed return precautions and outpatient follow-up.  Patient understands and agrees with plan.  He was discharged in stable condition.   Patient's presentation is most consistent with acute, uncomplicated illness.    FINAL CLINICAL IMPRESSION(S) / ED DIAGNOSES   Final diagnoses:  Medication refill  Uncomplicated asthma, unspecified asthma severity, unspecified whether persistent     Rx / DC Orders   ED Discharge Orders          Ordered    albuterol (VENTOLIN HFA) 108 (90 Base) MCG/ACT inhaler  Every 6 hours PRN        04/09/22 1021             Note:  This document was prepared using Dragon voice recognition software and may include unintentional dictation errors.   Keturah Shavers 04/09/22 1221    Jene Every, MD 04/09/22 1440

## 2022-04-09 NOTE — Discharge Instructions (Addendum)
Your inhalers were refilled for you. Return for any new, worsening or change in symptoms or other concerns.

## 2022-04-09 NOTE — ED Provider Triage Note (Signed)
Emergency Medicine Provider Triage Evaluation Note  Tyler Friedman , a 21 y.o. male  was evaluated in triage.  Pt complains of medication refill.  He reports that his inhaler stopped releasing any of the medicine even though it still has medicine in it.  He reports that he gets stuck when he tries to push it down.  He does not feel short of breath or have any pain currently.  Review of Systems  Positive: Med refill Negative: Sob, chest pain, leg swelling  Physical Exam  There were no vitals taken for this visit. Gen:   Awake, no distress   Resp:  Normal effort  MSK:   Moves extremities without difficulty  Other:    Medical Decision Making  Medically screening exam initiated at 10:18 AM.  Appropriate orders placed.  Tyler Friedman was informed that the remainder of the evaluation will be completed by another provider, this initial triage assessment does not replace that evaluation, and the importance of remaining in the ED until their evaluation is complete.     Marquette Old, PA-C 04/09/22 1024

## 2022-04-09 NOTE — ED Triage Notes (Signed)
Pt to ED via POV, pt states that his inhaler broke and he needs a refill on it

## 2022-06-15 ENCOUNTER — Other Ambulatory Visit: Payer: Self-pay

## 2022-06-15 ENCOUNTER — Emergency Department
Admission: EM | Admit: 2022-06-15 | Discharge: 2022-06-15 | Disposition: A | Discharge status: Home / Self Care | Payer: 59 | Attending: Emergency Medicine | Admitting: Emergency Medicine

## 2022-06-15 DIAGNOSIS — J4520 Mild intermittent asthma, uncomplicated: Secondary | ICD-10-CM | POA: Insufficient documentation

## 2022-06-15 DIAGNOSIS — Z760 Encounter for issue of repeat prescription: Secondary | ICD-10-CM | POA: Diagnosis not present

## 2022-06-15 MED ORDER — ALBUTEROL SULFATE HFA 108 (90 BASE) MCG/ACT IN AERS: 2.0000 | INHALATION_SPRAY | Freq: Four times a day (QID) | RESPIRATORY_TRACT | 2 refills | Status: DC | PRN

## 2022-06-15 NOTE — ED Triage Notes (Signed)
Reports had a mild asthma attack at work this morning but is feeling better.  Reports its been under control with an inahler which he is out of and wanting a refill.

## 2022-06-15 NOTE — ED Notes (Signed)
States was moving really fast at work and states that it may have triggered his asthma. Needs an inhaler. Did not have it at work. Denies chest soreness or tightness.

## 2022-06-15 NOTE — ED Provider Notes (Addendum)
[facility] Provider Note    Event Date/Time   First MD Initiated Contact with Patient 06/15/22 1013     (approximate)   History   Medication Refill   HPI  Tyler Friedman is a 21 y.o. male   Past medical history of who presents to the emergency department requesting a refill of his albuterol inhaler which she ran out of.  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    No resp infectious symptoms and feels well now.  No other acute medical complaints.    History was obtained via the patient.      Physical Exam   Triage Vital Signs: ED Triage Vitals  Enc Vitals Group     BP 06/15/22 0934 (!) 153/69     Pulse Rate 06/15/22 0934 60     Resp 06/15/22 0934 18     Temp 06/15/22 0934 98.4 F (36.9 C)     Temp Source 06/15/22 0934 Oral     SpO2 06/15/22 0934 99 %     Weight 06/15/22 0935 285 lb (129.3 kg)     Height 06/15/22 0935 5\' 10"  (1.778 m)     Head Circumference --      Peak Flow --      Pain Score 06/15/22 0935 0     Pain Loc --      Pain Edu? --      Excl. in GC? --     Most recent vital signs: Vitals:   06/15/22 0934  BP: (!) 153/69  Pulse: 60  Resp: 18  Temp: 98.4 F (36.9 C)  SpO2: 99%    General: Awake, no distress.  CV:  Good peripheral perfusion.  Resp:  Normal effort.  Abd:  No distention.  Other:  Scant wheezing on auscultation at the apices bilaterally no respiratory distress hemodynamics appropriate and reassuring no hypoxemia.   ED Results / Procedures / Treatments   Labs (all labs ordered are listed, but only abnormal results are displayed) Labs Reviewed - No data to display   PROCEDURES:  Critical Care performed: No  Procedures   MEDICATIONS ORDERED IN ED: Medications - No data to display  IMPRESSION / MDM / ASSESSMENT AND PLAN / ED COURSE  I reviewed the triage vital  signs and the nursing notes.                              Differential diagnosis includes, but is not limited to, mild asthma exacerbation, respiratory infection, medication refill    MDM: Appears well, no acute respiratory distress very scant wheezing request for albuterol inhaler.  I prescribed this medication.  I do not think it is a steroid burst right now.  Anticipatory guidance given and follow-up with PMD and return if any worsening respiratory symptoms or new symptoms.  Patient's presentation is most consistent with acute presentation with potential threat to life or bodily function.       FINAL CLINICAL IMPRESSION(S) / ED DIAGNOSES   Final diagnoses:  Mild intermittent asthma without complication  Medication refill     Rx / DC Orders   ED Discharge Orders          Ordered    albuterol (VENTOLIN HFA) 108 (90 Base) MCG/ACT inhaler  Every 6 hours PRN  06/15/22 1022             Note:  This document was prepared using Dragon voice recognition software and may include unintentional dictation errors.    Pilar Jarvis, MD 06/15/22 1028    Pilar Jarvis, MD 06/15/22 [phone]

## 2022-09-24 IMAGING — CR DG CHEST 2V
2 series · 2 of 2 positions shown · non-contrast
Comparison: None.

CLINICAL DATA: Cough, shortness of breath and wheezing. History of
asthma.

EXAM:
CHEST - 2 VIEW

[chest pa]
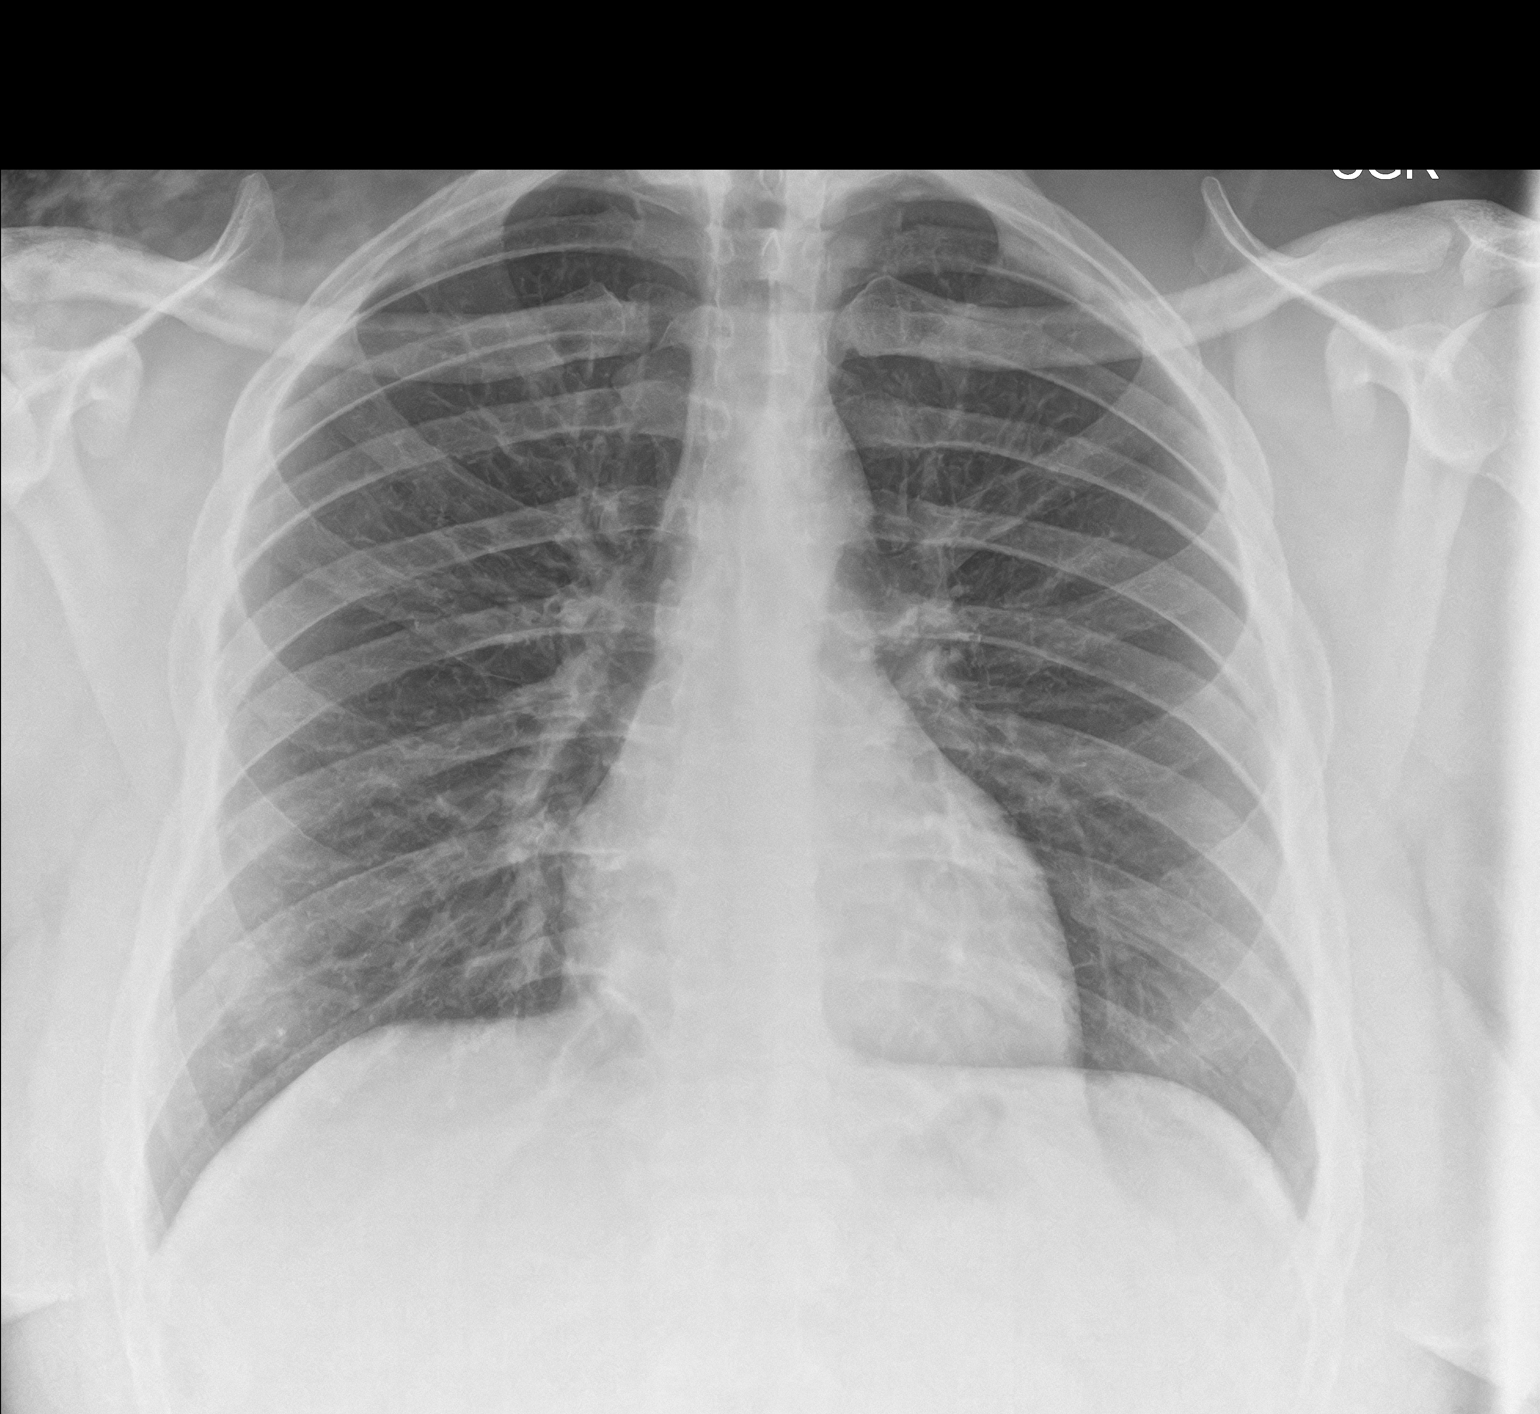

[chest lat]
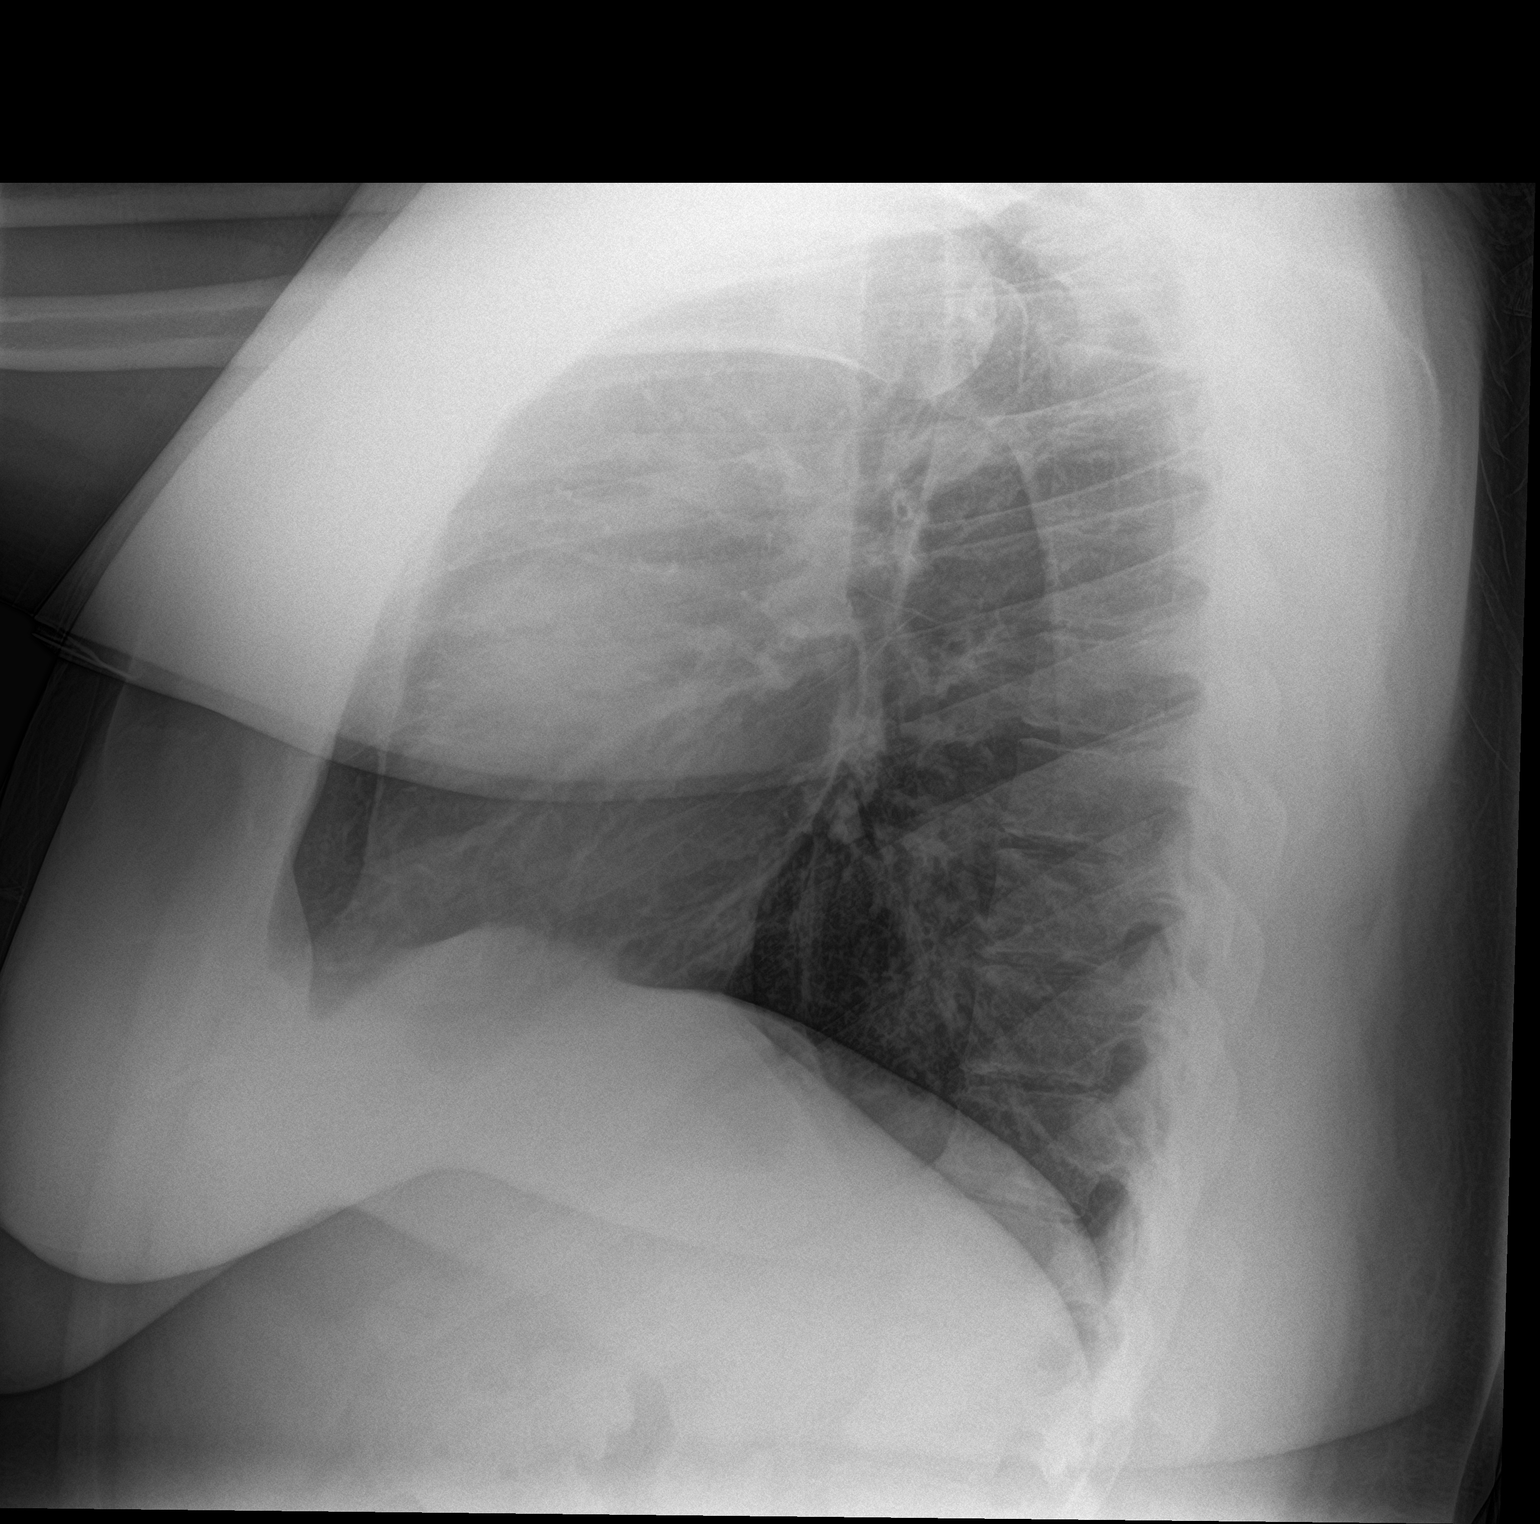

[2 of 2 positions shown; findings below may reference images not displayed]

FINDINGS: The heart size and mediastinal contours are within normal limits.
There is no evidence of pulmonary edema, consolidation,
pneumothorax, nodule or pleural fluid. No significant
hyperinflation. The visualized skeletal structures are unremarkable.
IMPRESSION: No active cardiopulmonary disease.

## 2023-02-04 ENCOUNTER — Emergency Department
Admission: EM | Admit: 2023-02-04 | Discharge: 2023-02-04 | Disposition: A | Discharge status: Home / Self Care | Payer: 59 | Attending: Emergency Medicine | Admitting: Emergency Medicine

## 2023-02-04 ENCOUNTER — Other Ambulatory Visit: Payer: Self-pay

## 2023-02-04 DIAGNOSIS — J45909 Unspecified asthma, uncomplicated: Secondary | ICD-10-CM | POA: Insufficient documentation

## 2023-02-04 DIAGNOSIS — Z760 Encounter for issue of repeat prescription: Secondary | ICD-10-CM | POA: Insufficient documentation

## 2023-02-04 DIAGNOSIS — I10 Essential (primary) hypertension: Secondary | ICD-10-CM | POA: Insufficient documentation

## 2023-02-04 MED ORDER — ALBUTEROL SULFATE HFA 108 (90 BASE) MCG/ACT IN AERS: 2.0000 | INHALATION_SPRAY | RESPIRATORY_TRACT | 2 refills | Status: DC | PRN

## 2023-02-04 NOTE — Discharge Instructions (Addendum)
Please take your blood pressure pill as prescribed by your PCP

## 2023-02-04 NOTE — ED Provider Notes (Signed)
[facility] Provider Note    Event Date/Time   First MD Initiated Contact with Patient 02/04/23 [phone]     (approximate)   History   Medication refill   HPI  Tyler Friedman is a 22 y.o. male with a history of asthma and hypertension who presents for refill of albuterol, he denies shortness of breath.  He reports he has not been taking his medication that he has at home for blood pressure     Physical Exam   Triage Vital Signs: ED Triage Vitals  Encounter Vitals Group     BP 02/04/23 0849 (!) 207/120     Systolic BP Percentile --      Diastolic BP Percentile --      Pulse Rate 02/04/23 0849 (!) 57     Resp 02/04/23 0849 20     Temp 02/04/23 0847 98.4 F (36.9 C)     Temp src --      SpO2 02/04/23 0849 98 %     Weight 02/04/23 0848 133.8 kg (295 lb)     Height 02/04/23 0848 1.778 m (5\' 10" )     Head Circumference --      Peak Flow --      Pain Score 02/04/23 0848 0     Pain Loc --      Pain Education --      Exclude from Growth Chart --     Most recent vital signs: Vitals:   02/04/23 0847 02/04/23 0849  BP:  (!) 207/120  Pulse:  (!) 57  Resp:  20  Temp: 98.4 F (36.9 C)   SpO2:  98%     General: Awake, no distress.  CV:  Good peripheral perfusion.  Resp:  Normal effort.  Clear to auscultation bilaterally Abd:  No distention.  Other:     ED Results / Procedures / Treatments   Labs (all labs ordered are listed, but only abnormal results are displayed) Labs Reviewed - No data to display   EKG     RADIOLOGY     PROCEDURES:  Critical Care performed:   Procedures   MEDICATIONS ORDERED IN ED: Medications - No data to display   IMPRESSION / MDM / ASSESSMENT AND PLAN / ED COURSE  I reviewed the triage vital signs and the nursing notes. Patient's presentation is most consistent with exacerbation of chronic illness.  Refill of albuterol provided, discussed with him that he can see his PCP for refills as  well.   Patient's blood pressure is elevated, this is a chronic problem for him.  He notes that his PCP wrote him a prescription for blood pressure medication which she has at home but he has not been taking it like he is supposed to.  He does not want a new prescription at this time.  He states that he will start taking his blood pressure medication.  I strongly urged him to follow-up within 1 week with his PCP        FINAL CLINICAL IMPRESSION(S) / ED DIAGNOSES   Final diagnoses:  Medication refill  Uncontrolled hypertension     Rx / DC Orders   ED Discharge Orders          Ordered    albuterol (VENTOLIN HFA) 108 (90 Base) MCG/ACT inhaler  Every 4 hours PRN        02/04/23 0858             Note:  This document was prepared  using Conservation officer, historic buildings and may include unintentional dictation errors.   Jene Every, MD 02/04/23 1251

## 2023-02-04 NOTE — ED Triage Notes (Signed)
Pt states needs new prescription for asthma inhaler. Reports has hx of HTN, has meds for it but has not taken them in a month.

## 2023-03-07 ENCOUNTER — Emergency Department
Admission: EM | Admit: 2023-03-07 | Discharge: 2023-03-07 | Disposition: A | Discharge status: Home / Self Care | Payer: 59 | Attending: Student in an Organized Health Care Education/Training Program | Admitting: Student in an Organized Health Care Education/Training Program

## 2023-03-07 ENCOUNTER — Encounter: Payer: Self-pay | Admitting: Emergency Medicine

## 2023-03-07 ENCOUNTER — Emergency Department: Payer: 59

## 2023-03-07 ENCOUNTER — Other Ambulatory Visit: Payer: Self-pay

## 2023-03-07 DIAGNOSIS — R918 Other nonspecific abnormal finding of lung field: Secondary | ICD-10-CM | POA: Diagnosis not present

## 2023-03-07 DIAGNOSIS — R0602 Shortness of breath: Secondary | ICD-10-CM | POA: Diagnosis not present

## 2023-03-07 DIAGNOSIS — J45901 Unspecified asthma with (acute) exacerbation: Secondary | ICD-10-CM | POA: Insufficient documentation

## 2023-03-07 DIAGNOSIS — I10 Essential (primary) hypertension: Secondary | ICD-10-CM | POA: Diagnosis not present

## 2023-03-07 MED ORDER — PREDNISONE 20 MG PO TABS: 40.0000 mg | ORAL_TABLET | Freq: Every day | ORAL | 0 refills | Status: AC

## 2023-03-07 MED ORDER — IPRATROPIUM-ALBUTEROL 0.5-2.5 (3) MG/3ML IN SOLN
3.0000 mL | Freq: Once | RESPIRATORY_TRACT | Status: AC
  Administered 2023-03-07: 3 mL via RESPIRATORY_TRACT
  Filled 2023-03-07: qty 3

## 2023-03-07 MED ORDER — ALBUTEROL SULFATE HFA 108 (90 BASE) MCG/ACT IN AERS: 2.0000 | INHALATION_SPRAY | RESPIRATORY_TRACT | 2 refills | Status: AC | PRN

## 2023-03-07 MED ORDER — PREDNISONE 20 MG PO TABS
60.0000 mg | ORAL_TABLET | Freq: Once | ORAL | Status: AC
  Administered 2023-03-07: 60 mg via ORAL
  Filled 2023-03-07: qty 3

## 2023-03-07 NOTE — ED Provider Notes (Signed)
[facility] Provider Note    Event Date/Time   First MD Initiated Contact with Patient 03/07/23 1128     (approximate)   History   Asthma   HPI  Tyler Friedman is a 22 y.o. male with PMH of asthma and elevated blood pressure presents for evaluation of increasing asthma symptoms.  Patient states for the last few weeks he has needed to use his albuterol inhaler a few times a day, however the past couple nights he has been using it several times throughout the night.  He reports an increase in his shortness of breath and chest tightness.  Patient also states that he has not taken his blood pressure medication in the past 3 days because he forgets to take it.  Patient denies fever, chills, headache, blurry vision, chest pain.        Physical Exam   Triage Vital Signs: ED Triage Vitals  Encounter Vitals Group     BP 03/07/23 1111 (!) 177/122     Systolic BP Percentile --      Diastolic BP Percentile --      Pulse Rate 03/07/23 1111 (!) 101     Resp 03/07/23 1111 20     Temp 03/07/23 1111 98.4 F (36.9 C)     Temp Source 03/07/23 1111 Oral     SpO2 03/07/23 1111 94 %     Weight 03/07/23 1113 290 lb (131.5 kg)     Height 03/07/23 1113 5\' 10"  (1.778 m)     Head Circumference --      Peak Flow --      Pain Score 03/07/23 1113 0     Pain Loc --      Pain Education --      Exclude from Growth Chart --     Most recent vital signs: Vitals:   03/07/23 1111  BP: (!) 177/122  Pulse: (!) 101  Resp: 20  Temp: 98.4 F (36.9 C)  SpO2: 94%    General: Awake, no distress.  CV:  Good peripheral perfusion.  RRR. Resp:  Normal effort.  Wheezing and rhonchi bilaterally.  Full sentences, tolerating secretions. Abd:  No distention.    ED Results / Procedures / Treatments   Labs (all labs ordered are listed, but only abnormal results are displayed) Labs Reviewed - No data to display   RADIOLOGY  Chest x-ray interpreted by me, I reviewed the  radiologist report as well.  Xrays show mild peribronchial thickening, no pneumonia or pneumothorax.  PROCEDURES:  Critical Care performed: No  Procedures   MEDICATIONS ORDERED IN ED: Medications  predniSONE (DELTASONE) tablet 60 mg (60 mg Oral Given 03/07/23 1147)  ipratropium-albuterol (DUONEB) 0.5-2.5 (3) MG/3ML nebulizer solution 3 mL (3 mLs Nebulization Given 03/07/23 1148)     IMPRESSION / MDM / ASSESSMENT AND PLAN / ED COURSE  I reviewed the triage vital signs and the nursing notes.                             22 year old male presents to the ED for evaluation of increase in his asthma symptoms.  Patient was hypertensive in triage but has not taken his blood pressure medication for the past 3 days.  He is also tachycardic but vital signs stable otherwise.  No evidence of respiratory distress.  Differential diagnosis includes, but is not limited to, asthma exacerbation, pneumonia, pneumothorax, hypertensive urgency, hypertensive emergency, uncontrolled hypertension.  Patient's  presentation is most consistent with acute complicated illness / injury requiring diagnostic workup.  I will get a chest x-ray to rule out a pneumonia and pneumothorax.  I interpreted the images as well as reviewed the radiologist report which was negative for any acute cardiopulmonary abnormalities but did show mild peribronchial thickening.  Patient will be given a dose of steroids and duo nebulizer.  If he has an improvement in his symptoms after this I feel he will be stable for discharge.  Patient did report improvement in his symptoms, so I will discharge him with a course of oral steroids and a refill for his albuterol inhaler.  I instructed him to follow-up with his primary care for further management of his asthma and blood pressure.  Patient voiced understanding, all questions were answered and he was stable at discharge.     FINAL CLINICAL IMPRESSION(S) / ED DIAGNOSES   Final diagnoses:   Exacerbation of asthma, unspecified asthma severity, unspecified whether persistent     Rx / DC Orders   ED Discharge Orders          Ordered    albuterol (VENTOLIN HFA) 108 (90 Base) MCG/ACT inhaler  Every 4 hours PRN        03/07/23 1303    predniSONE (DELTASONE) 20 MG tablet  Daily        03/07/23 1303             Note:  This document was prepared using Dragon voice recognition software and may include unintentional dictation errors.   Cameron Ali, PA-C 03/07/23 1307    Willy Eddy, MD 03/07/23 1429

## 2023-03-07 NOTE — ED Triage Notes (Addendum)
Pt reports that he has been having to use inhaler more frequently over the past couple weeks and state throughout the night last night he had to use inhaler multiple time, pt denies having a nebulizer machine, denies fever, states that the inhaler helps but is over using it and states that is abnormal for him  Pt states that he hasn't had his bp meds in 3 days  Pt states that he works at [employer] and is around a lot of mold and dust

## 2023-03-07 NOTE — Discharge Instructions (Addendum)
Please take your blood pressure medication every day.  It is important that you take this medication regularly to prevent further problems with your health.  Please follow-up with your primary care provider for further management of your asthma.  Based on the number of times that you need to use your inhaler, I would recommend a step up in your treatment, like a combination steroid and albuterol inhaler.  Your primary care provider can help with this.

## 2023-04-05 ENCOUNTER — Telehealth: Payer: Self-pay

## 2023-04-05 NOTE — Telephone Encounter (Signed)
Transition Care Management Unsuccessful Follow-up Telephone Call  Date of discharge and from where:  [place] 9/12  Attempts:  1st Attempt  Reason for unsuccessful TCM follow-up call:  No answer/busy   Lenard Forth [place]  [facility], [facility] Guide, Phone: [phone] Website: [email or web address]

## 2023-04-05 NOTE — Telephone Encounter (Signed)
Transition Care Management Unsuccessful Follow-up Telephone Call  Date of discharge and from where:  [place] 9/12  Attempts:  2nd Attempt  Reason for unsuccessful TCM follow-up call:  No answer/busy   Lenard Forth [place]  [facility], [facility] Guide, Phone: [phone] Website: [email or web address]

## 2023-04-21 ENCOUNTER — Other Ambulatory Visit: Payer: Self-pay

## 2023-04-21 ENCOUNTER — Emergency Department
Admission: EM | Admit: 2023-04-21 | Discharge: 2023-04-21 | Disposition: A | Discharge status: Home / Self Care | Payer: 59 | Attending: Emergency Medicine | Admitting: Emergency Medicine

## 2023-04-21 DIAGNOSIS — R062 Wheezing: Secondary | ICD-10-CM | POA: Diagnosis not present

## 2023-04-21 DIAGNOSIS — J4521 Mild intermittent asthma with (acute) exacerbation: Secondary | ICD-10-CM | POA: Insufficient documentation

## 2023-04-21 MED ORDER — METHYLPREDNISOLONE SODIUM SUCC 125 MG IJ SOLR
125.0000 mg | Freq: Once | INTRAMUSCULAR | Status: AC
  Administered 2023-04-21: 125 mg via INTRAMUSCULAR
  Filled 2023-04-21: qty 2

## 2023-04-21 MED ORDER — PREDNISONE 50 MG PO TABS: 50.0000 mg | ORAL_TABLET | Freq: Every day | ORAL | 0 refills | Status: AC

## 2023-04-21 MED ORDER — IPRATROPIUM-ALBUTEROL 0.5-2.5 (3) MG/3ML IN SOLN
6.0000 mL | Freq: Once | RESPIRATORY_TRACT | Status: AC
  Administered 2023-04-21: 6 mL via RESPIRATORY_TRACT
  Filled 2023-04-21: qty 6

## 2023-04-21 NOTE — ED Triage Notes (Signed)
Needs asthma meds refilled

## 2023-04-21 NOTE — ED Provider Notes (Signed)
[facility] Provider Note  Patient Contact: 7:50 PM (approximate)   History   Medication Refill   HPI  Tyler Friedman is a 22 y.o. male who presents the emergency department complaining that he thinks he has another asthma exacerbation.  Patient states that he uses his albuterol, has had some tightness and wheezing.  Patient states that he had a similar episode a month ago with an exacerbation that required steroids.  No increased work of breathing.  Patient denies any URI symptoms no congestion, sore throat, fevers or chills.     Physical Exam   Triage Vital Signs: ED Triage Vitals  Encounter Vitals Group     BP 04/21/23 1850 (!) 165/110     Systolic BP Percentile --      Diastolic BP Percentile --      Pulse Rate 04/21/23 1851 78     Resp 04/21/23 1851 19     Temp 04/21/23 1851 97.7 F (36.5 C)     Temp src --      SpO2 04/21/23 1851 97 %     Weight 04/21/23 1850 290 lb (131.5 kg)     Height 04/21/23 1850 5\' 10"  (1.778 m)     Head Circumference --      Peak Flow --      Pain Score 04/21/23 1850 0     Pain Loc --      Pain Education --      Exclude from Growth Chart --     Most recent vital signs: Vitals:   04/21/23 1850 04/21/23 1851  BP: (!) 165/110 (!) 165/110  Pulse:  78  Resp:  19  Temp:  97.7 F (36.5 C)  SpO2:  97%     General: Alert and in no acute distress. ENT:      Ears:       Nose: No congestion/rhinnorhea.      Mouth/Throat: Mucous membranes are moist.  Neck: No stridor. No cervical spine tenderness to palpation.  Cardiovascular:  Good peripheral perfusion Respiratory: Normal respiratory effort without tachypnea or retractions. Lungs with faint wheezing bilateral lower lung fields.  No expiratory wheezing.  No rales or rhonchi.Peri Jefferson air entry to the bases with no decreased or absent breath sounds. Musculoskeletal: Full range of motion to all extremities.  Neurologic:  No gross focal neurologic deficits are  appreciated.  Skin:   No rash noted Other:   ED Results / Procedures / Treatments   Labs (all labs ordered are listed, but only abnormal results are displayed) Labs Reviewed - No data to display   EKG     RADIOLOGY    No results found.  PROCEDURES:  Critical Care performed: No  Procedures   MEDICATIONS ORDERED IN ED: Medications  ipratropium-albuterol (DUONEB) 0.5-2.5 (3) MG/3ML nebulizer solution 6 mL (6 mLs Nebulization Given 04/21/23 2022)  methylPREDNISolone sodium succinate (SOLU-MEDROL) 125 mg/2 mL injection 125 mg (125 mg Intramuscular Given 04/21/23 2022)     IMPRESSION / MDM / ASSESSMENT AND PLAN / ED COURSE  I reviewed the triage vital signs and the nursing notes.                                 Differential diagnosis includes, but is not limited to, asthma exacerbation, viral illness   Patient's presentation is most consistent with acute presentation with potential threat to life or bodily function.   Patient's diagnosis  is consistent with asthma exacerbation.  Patient presents to the emergency department with increased wheezing.  Patient has a history of asthma and believes that the weather change has exacerbated his symptoms.  Patient states that he typically needs steroids when this happens.  He had some wheezing on exam which has resolved with his DuoNeb treatments here in the emergency department.  Patient will have a prescription of prednisone.  Follow-up with primary care as needed.. Patient is given ED precautions to return to the ED for any worsening or new symptoms.     FINAL CLINICAL IMPRESSION(S) / ED DIAGNOSES   Final diagnoses:  Mild intermittent asthma with exacerbation     Rx / DC Orders   ED Discharge Orders          Ordered    predniSONE (DELTASONE) 50 MG tablet  Daily with breakfast        04/21/23 2045             Note:  This document was prepared using Dragon voice recognition software and may include  unintentional dictation errors.   Lanette Hampshire 04/21/23 2046    Minna Antis, MD 04/21/23 2300

## 2023-05-04 DIAGNOSIS — Z1389 Encounter for screening for other disorder: Secondary | ICD-10-CM | POA: Diagnosis not present

## 2023-05-04 DIAGNOSIS — I10 Essential (primary) hypertension: Secondary | ICD-10-CM | POA: Diagnosis not present

## 2023-05-04 DIAGNOSIS — Z0131 Encounter for examination of blood pressure with abnormal findings: Secondary | ICD-10-CM | POA: Diagnosis not present

## 2023-05-04 DIAGNOSIS — J45909 Unspecified asthma, uncomplicated: Secondary | ICD-10-CM | POA: Diagnosis not present

## 2023-06-15 DIAGNOSIS — Z1389 Encounter for screening for other disorder: Secondary | ICD-10-CM | POA: Diagnosis not present

## 2023-06-15 DIAGNOSIS — Z1331 Encounter for screening for depression: Secondary | ICD-10-CM | POA: Diagnosis not present

## 2023-06-15 DIAGNOSIS — Z0131 Encounter for examination of blood pressure with abnormal findings: Secondary | ICD-10-CM | POA: Diagnosis not present

## 2023-06-15 DIAGNOSIS — J45909 Unspecified asthma, uncomplicated: Secondary | ICD-10-CM | POA: Diagnosis not present

## 2023-06-15 DIAGNOSIS — I10 Essential (primary) hypertension: Secondary | ICD-10-CM | POA: Diagnosis not present

## 2023-09-02 ENCOUNTER — Emergency Department
Admission: EM | Admit: 2023-09-02 | Discharge: 2023-09-02 | Disposition: A | Discharge status: Home / Self Care | Attending: Emergency Medicine | Admitting: Emergency Medicine

## 2023-09-02 ENCOUNTER — Emergency Department

## 2023-09-02 ENCOUNTER — Other Ambulatory Visit: Payer: Self-pay

## 2023-09-02 DIAGNOSIS — D72829 Elevated white blood cell count, unspecified: Secondary | ICD-10-CM | POA: Insufficient documentation

## 2023-09-02 DIAGNOSIS — J45901 Unspecified asthma with (acute) exacerbation: Secondary | ICD-10-CM | POA: Diagnosis not present

## 2023-09-02 DIAGNOSIS — R509 Fever, unspecified: Secondary | ICD-10-CM | POA: Diagnosis present

## 2023-09-02 DIAGNOSIS — J101 Influenza due to other identified influenza virus with other respiratory manifestations: Secondary | ICD-10-CM | POA: Insufficient documentation

## 2023-09-02 DIAGNOSIS — R5383 Other fatigue: Secondary | ICD-10-CM | POA: Insufficient documentation

## 2023-09-02 LAB — CBC
HCT: 39.6 % (ref 39.0–52.0)
Hemoglobin: 12.9 g/dL — ABNORMAL LOW (ref 13.0–17.0)
MCH: 29.1 pg (ref 26.0–34.0)
MCHC: 32.6 g/dL (ref 30.0–36.0)
MCV: 89.4 fL (ref 80.0–100.0)
Platelets: 224 10*3/uL (ref 150–400)
RBC: 4.43 MIL/uL (ref 4.22–5.81)
RDW: 12.2 % (ref 11.5–15.5)
WBC: 10.8 10*3/uL — ABNORMAL HIGH (ref 4.0–10.5)
nRBC: 0 % (ref 0.0–0.2)

## 2023-09-02 LAB — BASIC METABOLIC PANEL
Anion gap: 11 (ref 5–15)
BUN: 13 mg/dL (ref 6–20)
CO2: 23 mmol/L (ref 22–32)
Calcium: 8.8 mg/dL — ABNORMAL LOW (ref 8.9–10.3)
Chloride: 101 mmol/L (ref 98–111)
Creatinine, Ser: 1.05 mg/dL (ref 0.61–1.24)
GFR, Estimated: >60 mL/min (ref >60)
Glucose, Bld: 109 mg/dL — ABNORMAL HIGH (ref 70–99)
Potassium: 3.3 mmol/L — ABNORMAL LOW (ref 3.5–5.1)
Sodium: 135 mmol/L (ref 135–145)

## 2023-09-02 LAB — D-DIMER, QUANTITATIVE: D-Dimer, Quant: <0.27 ug{FEU}/mL (ref 0.00–0.50)

## 2023-09-02 LAB — RESP PANEL BY RT-PCR (RSV, FLU A&B, COVID)  RVPGX2
Influenza A by PCR: POSITIVE — AB
Influenza B by PCR: NEGATIVE
Resp Syncytial Virus by PCR: NEGATIVE
SARS Coronavirus 2 by RT PCR: NEGATIVE

## 2023-09-02 MED ORDER — PREDNISONE 20 MG PO TABS: 40.0000 mg | ORAL_TABLET | Freq: Every day | ORAL | 0 refills | Status: AC

## 2023-09-02 MED ORDER — SODIUM CHLORIDE 0.9 % IV BOLUS
1000.0000 mL | Freq: Once | INTRAVENOUS | Status: AC
  Administered 2023-09-02: 1000 mL via INTRAVENOUS

## 2023-09-02 MED ORDER — IPRATROPIUM-ALBUTEROL 0.5-2.5 (3) MG/3ML IN SOLN
6.0000 mL | Freq: Once | RESPIRATORY_TRACT | Status: AC
  Administered 2023-09-02: 6 mL via RESPIRATORY_TRACT
  Filled 2023-09-02: qty 3

## 2023-09-02 MED ORDER — METHYLPREDNISOLONE SODIUM SUCC 125 MG IJ SOLR
125.0000 mg | Freq: Once | INTRAMUSCULAR | Status: AC
  Administered 2023-09-02: 125 mg via INTRAVENOUS
  Filled 2023-09-02: qty 2

## 2023-09-02 MED ORDER — ACETAMINOPHEN 325 MG PO TABS
650.0000 mg | ORAL_TABLET | Freq: Once | ORAL | Status: AC
  Administered 2023-09-02: 650 mg via ORAL
  Filled 2023-09-02: qty 2

## 2023-09-02 NOTE — ED Provider Notes (Signed)
 [facility] Provider Note    Event Date/Time   First MD Initiated Contact with Patient 09/02/23 1458     (approximate)   History   Shortness of Breath and Fever   HPI  Tyler Friedman is a 23 year old male presenting to the emergency department for evaluation of shortness of breath and fever.  Yesterday, patient woke up with some cold symptoms.  Since that time, he has had worsening shortness of breath as well as development of fever and fatigue.  Does have a history of asthma, typically uses his rescue inhaler once a day at baseline.  Has been using it recently without significant improvement.     Physical Exam   Triage Vital Signs: ED Triage Vitals [09/02/23 1328]  Encounter Vitals Group     BP (!) 172/122     Systolic BP Percentile      Diastolic BP Percentile      Pulse Rate (!) 115     Resp (!) 23     Temp (!) 100.8 F (38.2 C)     Temp src      SpO2 100 %     Weight 300 lb (136.1 kg)     Height 5\' 9"  (1.753 m)     Head Circumference      Peak Flow      Pain Score 6     Pain Loc      Pain Education      Exclude from Growth Chart     Most recent vital signs: Vitals:   09/02/23 1328 09/02/23 1600  BP: (!) 172/122 (!) 168/108  Pulse: (!) 115 (!) 110  Resp: (!) 23 20  Temp: (!) 100.8 F (38.2 C) 100 F (37.8 C)  SpO2: 100% 100%     General: Awake, interactive  CV:  Tachycardia with regular rhythm, normal peripheral perfusion Resp:  Mild tachypnea, occasional expiratory wheeze, but lungs otherwise clear to auscultation Abd:  Nondistended Neuro:  Symmetric facial movement, fluid speech   ED Results / Procedures / Treatments   Labs (all labs ordered are listed, but only abnormal results are displayed) Labs Reviewed  RESP PANEL BY RT-PCR (RSV, FLU A&B, COVID)  RVPGX2 - Abnormal; Notable for the following components:      Result Value   Influenza A by PCR POSITIVE (*)    All other components within normal limits  BASIC  METABOLIC PANEL - Abnormal; Notable for the following components:   Potassium 3.3 (*)    Glucose, Bld 109 (*)    Calcium 8.8 (*)    All other components within normal limits  CBC - Abnormal; Notable for the following components:   WBC 10.8 (*)    Hemoglobin 12.9 (*)    All other components within normal limits  D-DIMER, QUANTITATIVE     EKG EKG independently reviewed interpreted by myself (ER attending) demonstrates:  EKG demonstrates sinus tachycardia at a rate of 110, PR 130, QRS 98, QTc 419, no acute ST changes  RADIOLOGY Imaging independently reviewed and interpreted by myself demonstrates:  CXR without focal consolidation   PROCEDURES:  Critical Care performed: No  Procedures   MEDICATIONS ORDERED IN ED: Medications  acetaminophen (TYLENOL) tablet 650 mg (650 mg Oral Given 09/02/23 1549)  sodium chloride 0.9 % bolus 1,000 mL (1,000 mLs Intravenous New Bag/Given 09/02/23 1548)  ipratropium-albuterol (DUONEB) 0.5-2.5 (3) MG/3ML nebulizer solution 6 mL (6 mLs Nebulization Given 09/02/23 1552)  methylPREDNISolone sodium succinate (SOLU-MEDROL) 125 mg/2 mL injection  125 mg (125 mg Intravenous Given 09/02/23 1549)     IMPRESSION / MDM / ASSESSMENT AND PLAN / ED COURSE  I reviewed the triage vital signs and the nursing notes.  Differential diagnosis includes, but is not limited to, viral illness, pneumonia, asthma flare, pulmonary embolism, pneumothorax  Patient's presentation is most consistent with acute presentation with potential threat to life or bodily function.  23 year old male presenting with shortness of breath found to be febrile and tachycardic on presentation.  Labs in triage with minimal leukocytosis to BC 10.8.  Flu test did return positive.  Clinical history seems most consistent with asthma flare likely precipitated by acute influenza infection.  Will treat symptomatically with Tylenol, DuoNeb, steroids, fluids.  Patient reassessed after receiving medications.   Feels much improved.  Does have improved air movement without any appreciable ongoing wheezing.  D-dimer negative.  Heart rate improved to the low 100s on reevaluation.  Patient is comfortable with discharge home which I do think is reasonable.  X-Martez Weiand without evidence of pneumonia, but will DC with short steroid course.  Discussed risks and benefits of Tamiflu and patient declines currently.  Strict return precautions provided.  Patient discharged stable condition.      FINAL CLINICAL IMPRESSION(S) / ED DIAGNOSES   Final diagnoses:  Influenza A  Exacerbation of asthma, unspecified asthma severity, unspecified whether persistent     Rx / DC Orders   ED Discharge Orders          Ordered    predniSONE (DELTASONE) 20 MG tablet  Daily with breakfast        09/02/23 1721             Note:  This document was prepared using Dragon voice recognition software and may include unintentional dictation errors.   Trinna Post, MD 09/02/23 [phone]

## 2023-09-02 NOTE — ED Triage Notes (Signed)
 Pt comes with sob that started yesterday. Pt states he was in bed all day due to no energy. Pt has hx of asthma. Pt  state he had cold at first and headache and now the sob.

## 2023-09-02 NOTE — Discharge Instructions (Signed)
 You are seen in the ER today for evaluation of your cough and shortness of breath.  Your flu test returned positive which I suspect has caused an asthma flare.  You can continue to use your inhaler
